# Patient Record
Sex: Male | Born: 1950 | Race: Black or African American | Hispanic: No | Marital: Single | Smoking: Current some day smoker
Health system: Southern US, Community
[De-identification: ages and names within clinical notes are randomized; demographics above are authoritative.]

## PROBLEM LIST (undated history)

## (undated) DIAGNOSIS — I10 Essential (primary) hypertension: Secondary | ICD-10-CM

## (undated) DIAGNOSIS — M1990 Unspecified osteoarthritis, unspecified site: Secondary | ICD-10-CM

## (undated) DIAGNOSIS — F319 Bipolar disorder, unspecified: Secondary | ICD-10-CM

## (undated) DIAGNOSIS — I2699 Other pulmonary embolism without acute cor pulmonale: Secondary | ICD-10-CM

## (undated) HISTORY — PX: HERNIA REPAIR: SHX51

## (undated) HISTORY — PX: TONSILLECTOMY: SUR1361

## (undated) HISTORY — PX: OTHER SURGICAL HISTORY: SHX169

## (undated) HISTORY — PX: ORIF SHOULDER DISLOCATION W/ HUMERAL FRACTURE: SUR959

---

## 1999-06-23 ENCOUNTER — Inpatient Hospital Stay (HOSPITAL_COMMUNITY): Admission: EM | Admit: 1999-06-23 | Discharge: 1999-06-29 | Payer: Self-pay | Admitting: *Deleted

## 1999-10-26 ENCOUNTER — Emergency Department (HOSPITAL_COMMUNITY): Admission: EM | Admit: 1999-10-26 | Discharge: 1999-10-26 | Payer: Self-pay | Admitting: Emergency Medicine

## 1999-10-27 ENCOUNTER — Encounter: Payer: Self-pay | Admitting: Emergency Medicine

## 2000-01-12 ENCOUNTER — Emergency Department (HOSPITAL_COMMUNITY): Admission: EM | Admit: 2000-01-12 | Discharge: 2000-01-12 | Payer: Self-pay | Admitting: Emergency Medicine

## 2005-10-17 ENCOUNTER — Emergency Department (HOSPITAL_COMMUNITY): Admission: EM | Admit: 2005-10-17 | Discharge: 2005-10-17 | Payer: Self-pay | Admitting: Emergency Medicine

## 2008-09-24 ENCOUNTER — Emergency Department (HOSPITAL_COMMUNITY): Admission: EM | Admit: 2008-09-24 | Discharge: 2008-09-24 | Payer: Self-pay | Admitting: Emergency Medicine

## 2010-01-16 ENCOUNTER — Emergency Department (HOSPITAL_COMMUNITY): Admission: EM | Admit: 2010-01-16 | Discharge: 2010-01-16 | Payer: Self-pay | Admitting: Emergency Medicine

## 2010-07-22 LAB — URINE CULTURE
Colony Count: >=100000
Culture  Setup Time: 201109101721

## 2010-07-22 LAB — URINALYSIS, ROUTINE W REFLEX MICROSCOPIC
Ketones, ur: 15 mg/dL — AB
Nitrite: POSITIVE — AB
Protein, ur: 30 mg/dL — AB
pH: 5.5 (ref 5.0–8.0)

## 2010-07-22 LAB — URINE MICROSCOPIC-ADD ON

## 2011-07-02 IMAGING — US US ART/VEN ABD/PELV/SCROTUM DOPPLER LTD
1 series · 13 of 25 positions shown · non-contrast
Comparison: None.

CLINICAL DATA: Left testicular pain.

SCROTAL ULTRASOUND
DOPPLER ULTRASOUND OF THE TESTICLES
TECHNIQUE: Complete ultrasound examination of the testicles,
epididymis, and other scrotal structures was performed.  Color and
spectral Doppler ultrasound were also utilized to evaluate blood
flow to the testicles.

[Series 1: us art/ven abd/pelv/scrotum doppler ltd · 0.07mm/px · 13 of 54 slices shown]
[im 1/54]
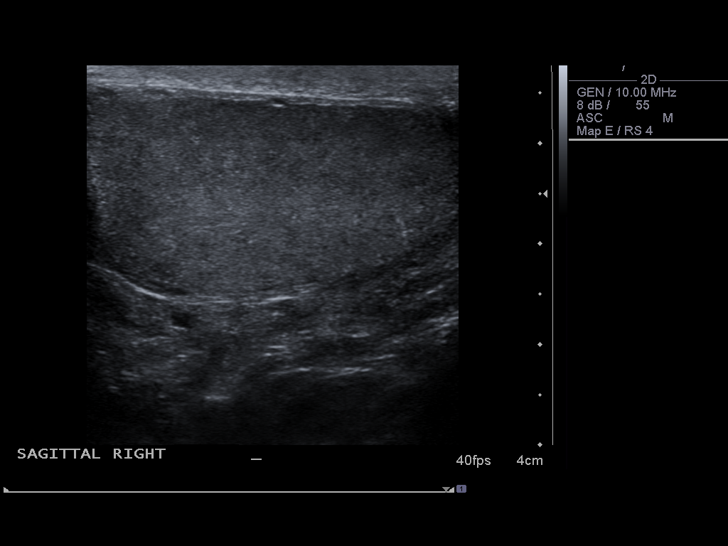
[im 5/54]
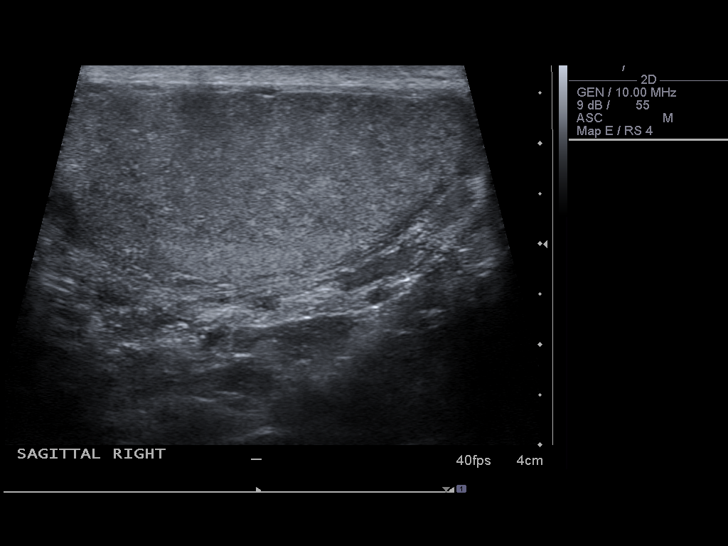
[im 9/54]
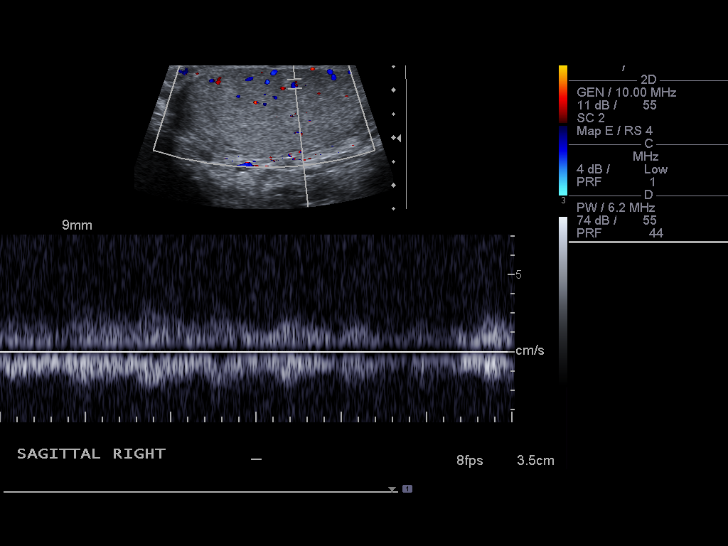
[im 14/54]
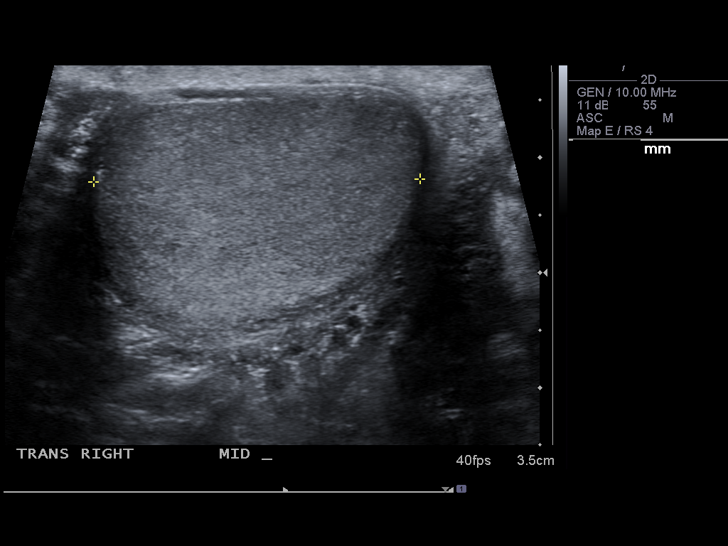
[im 18/54]
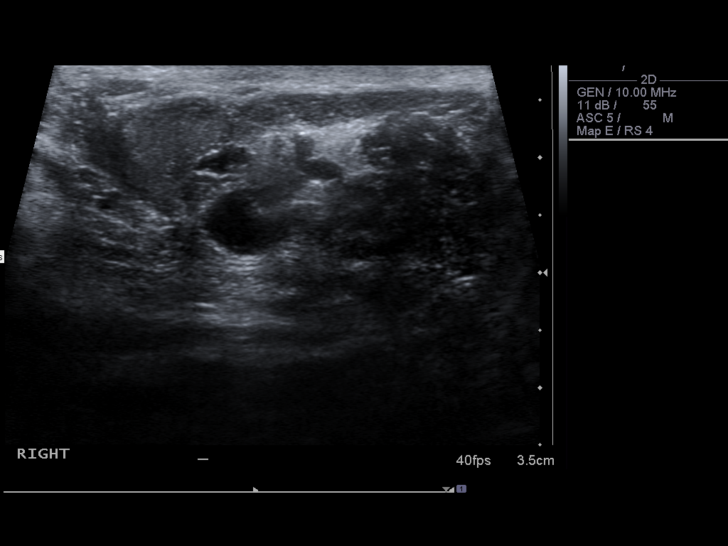
[im 23/54]
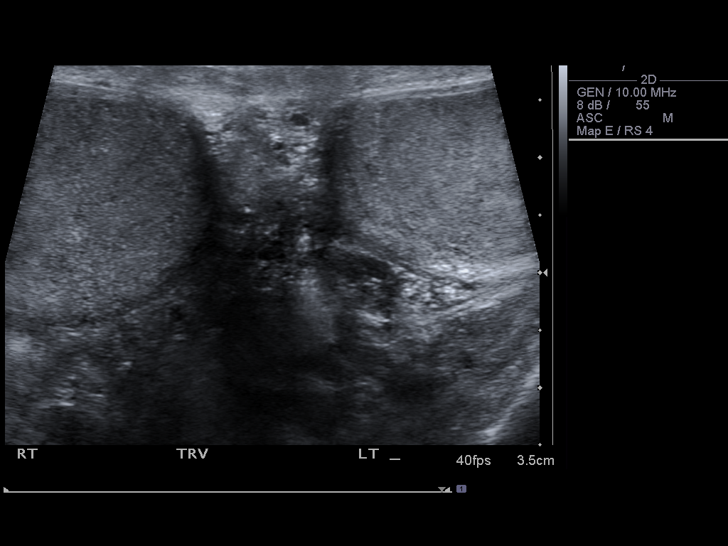
[im 27/54]
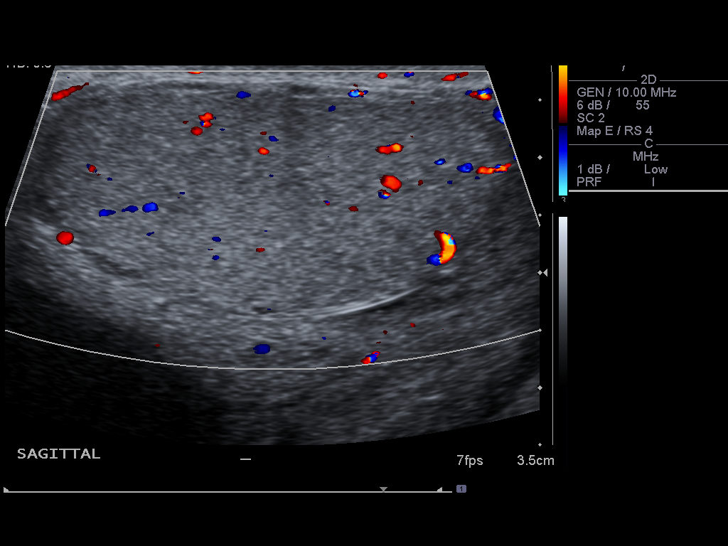
[im 31/54]
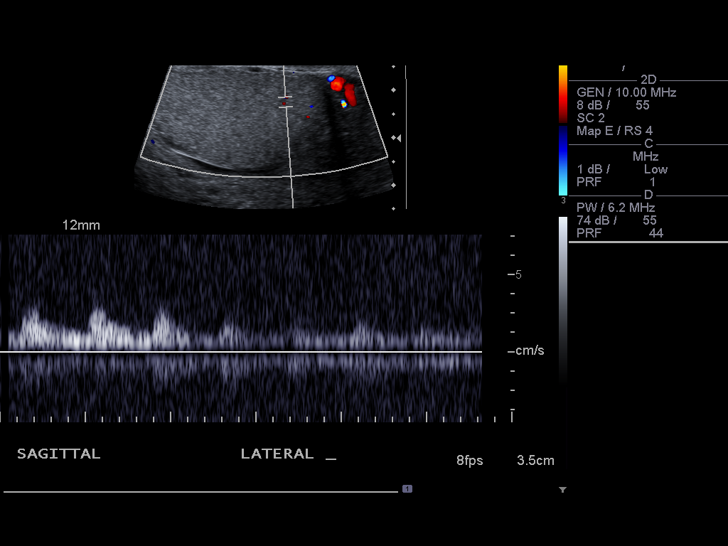
[im 36/54]
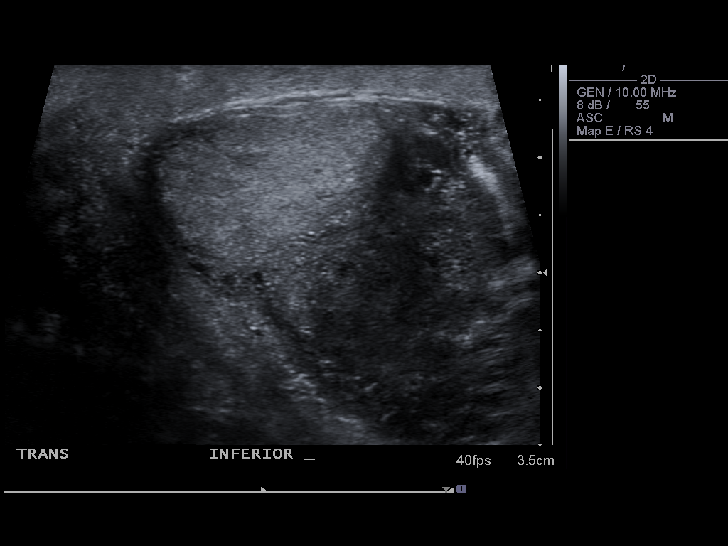
[im 40/54]
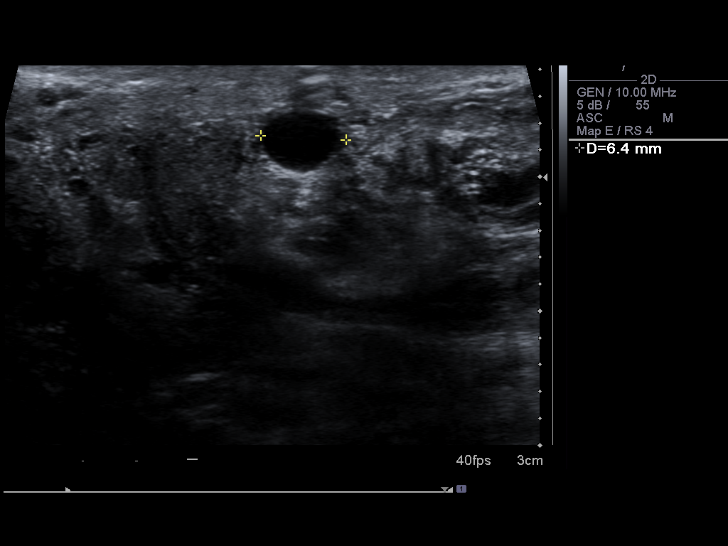
[im 45/54]
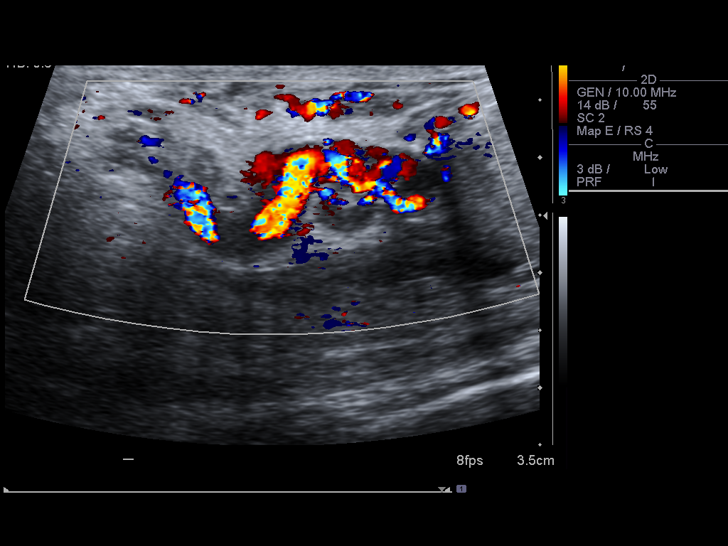
[im 49/54]
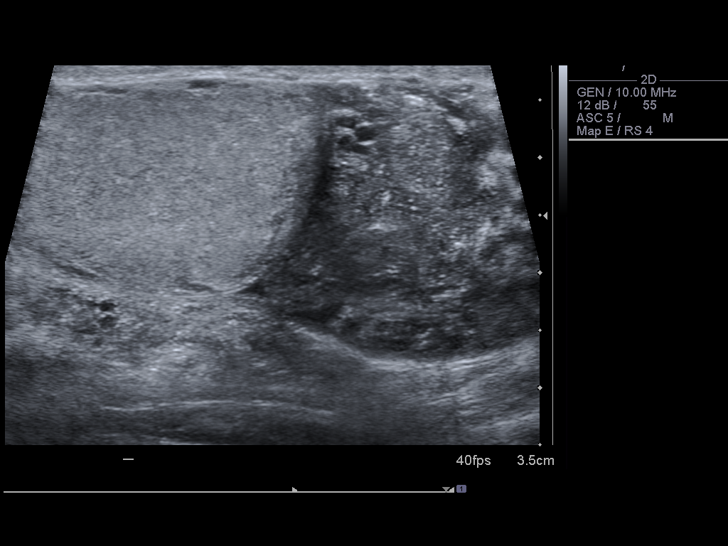
[im 54/54]
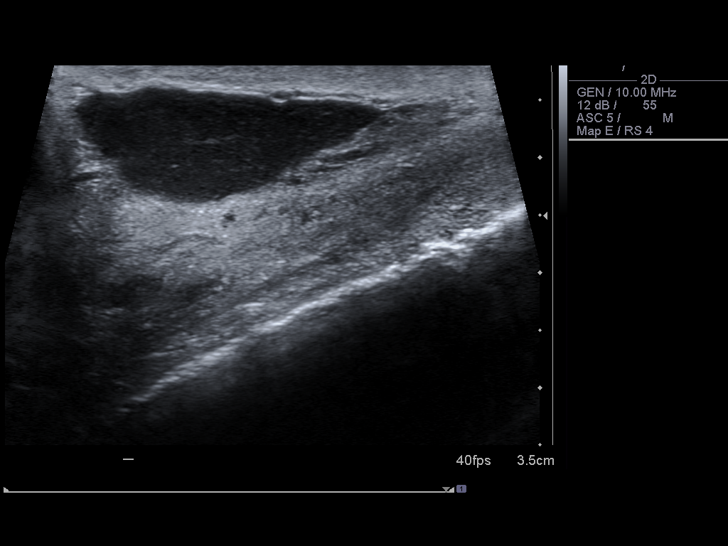

[13 of 25 positions shown; findings below may reference images not displayed]

FINDINGS: The testicles are symmetric in size and echogenicity.
No testicular masses are seen, and there is no evidence of
microlithiasis.

A small cyst or spermatocele is seen in the head of the right
epididymis measuring 8 mm, and in the head of the left epididymis
measuring 6 mm.

A large heterogeneous extratesticular mass is seen adjacent to the
left testicle which has large internal blood vessels and most
likely represents a hernia.  Small left hydrocele also noted.

Blood flow is seen within both testicles on color Doppler
sonography.  Doppler spectral waveforms show both arterial and
venous flow signal in both testicles.
IMPRESSION: 1.  No evidence of testicular mass or torsion.
2.  Large left extratesticular soft tissue mass, most likely
representing inguinal hernia.  Small left hydrocele also seen.
Recommend correlation with physical exam or CT for confirmation.

## 2014-03-21 ENCOUNTER — Ambulatory Visit (INDEPENDENT_AMBULATORY_CARE_PROVIDER_SITE_OTHER): Payer: Self-pay | Admitting: General Surgery

## 2014-03-21 ENCOUNTER — Other Ambulatory Visit (INDEPENDENT_AMBULATORY_CARE_PROVIDER_SITE_OTHER): Payer: Self-pay

## 2014-03-21 DIAGNOSIS — F1911 Other psychoactive substance abuse, in remission: Secondary | ICD-10-CM

## 2014-03-21 NOTE — H&P (Signed)
History of Present Illness Axel Filler(Vennela Jutte MD; 03/10/2014 1:48 PM) Patient words: Evaluate for Right Inguinal Hernia.  The patient is a 63 year old male who presents for an evaluation of a hernia. The patient is a 63 year old male who is referred by Dr. [name] for evaluation of a right inguinal hernia. The patient states it's been there for approximately 20 years. He states become more symptomatic at this time. Patient states it's not reducible.  Patient has had no signs or symptoms of strangulation.   Other Problems Maryan Puls([name], [place]; 03/10/2014 1:35 PM) Arthritis Back Pain Inguinal Hernia  Past Surgical History Maryan Puls([name]; 03/10/2014 1:35 PM) No pertinent past surgical history  Diagnostic Studies History Maryan Puls([name], [place]; 03/10/2014 1:35 PM) Colonoscopy never  Allergies Maryan Puls([name], [place]; 03/10/2014 1:35 PM) No Known Drug Allergies11/06/2013  Social History [name]([place]y [place]; 03/10/2014 1:35 PM) Alcohol use Recently quit alcohol use. Caffeine use Carbonated beverages, Coffee. Illicit drug use Uses weekly. Tobacco use Current every day smoker.  Family History Maryan Puls([name]; 03/10/2014 1:35 PM) Diabetes Mellitus Brother, Mother. Heart Disease Father. Hypertension Father.  Review of Systems Axel Filler(Cindi Ghazarian MD; 03/10/2014 1:47 PM) General Not Present- Appetite Loss, Chills, Fatigue, Fever, Night Sweats, Weight Gain and Weight Loss. Skin Not Present- Change in Wart/Mole, Dryness, Hives, Jaundice, New Lesions, Non-Healing Wounds, Rash and Ulcer. HEENT Not Present- Earache, Hearing Loss, Hoarseness, Nose Bleed, Oral Ulcers, Ringing in the Ears, Seasonal Allergies, Sinus Pain, Sore Throat, Visual Disturbances, Wears glasses/contact lenses and Yellow Eyes. Respiratory Not Present- Bloody sputum, Chronic Cough, Difficulty Breathing, Snoring and Wheezing. Breast Not Present- Breast Mass, Breast Pain, Nipple Discharge and Skin Changes. Cardiovascular Present-  Leg Cramps. Not Present- Chest Pain, Difficulty Breathing Lying Down, Palpitations, Rapid Heart Rate, Shortness of Breath and Swelling of Extremities. Gastrointestinal Not Present- Abdominal Pain, Bloating, Bloody Stool, Change in Bowel Habits, Chronic diarrhea, Constipation, Difficulty Swallowing, Excessive gas, Gets full quickly at meals, Hemorrhoids, Indigestion, Nausea, Rectal Pain and Vomiting. Male Genitourinary Not Present- Blood in Urine, Change in Urinary Stream, Frequency, Impotence, Nocturia, Painful Urination, Urgency and Urine Leakage. Musculoskeletal Not Present- Back Pain, Joint Pain, Joint Stiffness, Muscle Pain, Muscle Weakness and Swelling of Extremities. Neurological Not Present- Decreased Memory, Fainting, Headaches, Numbness, Seizures, Tingling, Tremor, Trouble walking and Weakness. Psychiatric Not Present- Anxiety, Bipolar, Change in Sleep Pattern, Depression, Fearful and Frequent crying. Endocrine Not Present- Cold Intolerance, Excessive Hunger, Hair Changes, Heat Intolerance and New Diabetes. Hematology Not Present- Easy Bruising, Excessive bleeding, Gland problems, HIV and Persistent Infections.   Vitals Maryan Puls([name] MA; 03/10/2014 1:34 PM) 03/10/2014 1:33 PM Weight: 193.4 lb Height: 75in Body Surface Area: 2.15 m Body Mass Index: 24.17 kg/m Temp.: 97.29F(Temporal)  Pulse: 71 (Regular)  BP: 150/92 (Sitting, Left Arm, Standard)    Physical Exam Axel Filler(Jonny Dearden MD; 03/10/2014 1:49 PM) General Mental Status-Alert. General Appearance-Consistent with stated age. Hydration-Well hydrated. Voice-Normal.  Head and Neck Head-normocephalic, atraumatic with no lesions or palpable masses.  Chest and Lung Exam Chest and lung exam reveals -quiet, even and easy respiratory effort with no use of accessory muscles, normal resonance, no flatness or dullness, non-tender and normal tactile fremitus and on auscultation, normal breath sounds, no adventitious  sounds and normal vocal resonance. Inspection Chest Wall - Normal. Back - normal.  Cardiovascular Cardiovascular examination reveals -on palpation PMI is normal in location and amplitude, no palpable S3 or S4. Normal cardiac borders., normal heart sounds, regular rate and rhythm with no murmurs, carotid auscultation reveals no bruits and normal pedal  pulses bilaterally.  Abdomen Inspection Skin - Scar - no surgical scars. Hernias - Diastasis recti - Present. Inguinal hernia - Right - Incarcerated. Palpation/Percussion Normal exam - Soft, Non Tender, No Rebound tenderness, No Rigidity (guarding) and No hepatosplenomegaly. Auscultation Normal exam - Bowel sounds normal.    Assessment & Plan Axel Filler(Jermya Dowding MD; 03/10/2014 1:50 PM) INGUINAL HERNIA (550.90  K40.90) Impression: 63 year old male with a right incarcerated inguinal hernia. The patient will like to proceed to the operating room for open right inguinal hernia repair with mesh. All risks and benefits were discussed with the patient to generally include, but not limited to: infection, bleeding, damage to surrounding structures, acute and chronic nerve pain, and recurrence. Alternatives were offered and described. All questions were answered and the patient voiced understanding of the procedure and wishes to proceed at this point with hernia repair. Current Plans  Recommend surgery I discussed hernia surgery with the patient to include the risks and benefits to include, but not limited to: infection, bleeding, damage to surrounding structures, chronic wound healing, nerve pain, and possible recurrence. The patient voiced understanding and wishes to proceed at this time.

## 2014-03-25 ENCOUNTER — Encounter (HOSPITAL_COMMUNITY)
Admission: RE | Admit: 2014-03-25 | Discharge: 2014-03-25 | Disposition: A | Discharge status: Home / Self Care | Payer: Medicare Other | Source: Ambulatory Visit | Attending: General Surgery | Admitting: General Surgery

## 2014-03-25 ENCOUNTER — Encounter (HOSPITAL_COMMUNITY): Payer: Self-pay

## 2014-03-25 DIAGNOSIS — M1990 Unspecified osteoarthritis, unspecified site: Secondary | ICD-10-CM | POA: Diagnosis not present

## 2014-03-25 DIAGNOSIS — F17210 Nicotine dependence, cigarettes, uncomplicated: Secondary | ICD-10-CM | POA: Diagnosis not present

## 2014-03-25 DIAGNOSIS — I10 Essential (primary) hypertension: Secondary | ICD-10-CM | POA: Diagnosis not present

## 2014-03-25 DIAGNOSIS — K4090 Unilateral inguinal hernia, without obstruction or gangrene, not specified as recurrent: Secondary | ICD-10-CM | POA: Diagnosis present

## 2014-03-25 HISTORY — DX: Unspecified osteoarthritis, unspecified site: M19.90

## 2014-03-25 HISTORY — DX: Bipolar disorder, unspecified: F31.9

## 2014-03-25 LAB — CBC
HEMATOCRIT: 48.1 % (ref 39.0–52.0)
HEMOGLOBIN: 16.4 g/dL (ref 13.0–17.0)
MCH: 29.8 pg (ref 26.0–34.0)
MCHC: 34.1 g/dL (ref 30.0–36.0)
MCV: 87.3 fL (ref 78.0–100.0)
Platelets: 203 10*3/uL (ref 150–400)
RBC: 5.51 MIL/uL (ref 4.22–5.81)
RDW: 13.5 % (ref 11.5–15.5)
WBC: 9.4 10*3/uL (ref 4.0–10.5)

## 2014-03-25 LAB — COMPREHENSIVE METABOLIC PANEL
ALT: 8 U/L (ref 0–53)
AST: 16 U/L (ref 0–37)
Albumin: 4 g/dL (ref 3.5–5.2)
Alkaline Phosphatase: 84 U/L (ref 39–117)
Anion gap: 19 — ABNORMAL HIGH (ref 5–15)
BILIRUBIN TOTAL: 0.4 mg/dL (ref 0.3–1.2)
BUN: 9 mg/dL (ref 6–23)
CALCIUM: 9 mg/dL (ref 8.4–10.5)
CHLORIDE: 101 meq/L (ref 96–112)
CO2: 21 meq/L (ref 19–32)
CREATININE: 0.82 mg/dL (ref 0.50–1.35)
GLUCOSE: 84 mg/dL (ref 70–99)
Potassium: 4 mEq/L (ref 3.7–5.3)
Sodium: 141 mEq/L (ref 137–147)
Total Protein: 7 g/dL (ref 6.0–8.3)

## 2014-03-25 MED ORDER — CEFAZOLIN SODIUM-DEXTROSE 2-3 GM-% IV SOLR
2.0000 g | INTRAVENOUS | Status: AC
  Administered 2014-03-26: 2 g via INTRAVENOUS
  Filled 2014-03-25: qty 50

## 2014-03-25 NOTE — Pre-Procedure Instructions (Signed)
Mark Cantu  03/25/2014   Your procedure is scheduled on:  Wednesday, Nov. 18th   [facility] 8:00 AM.   Call this number if you have problems the morning of surgery: [phone]   Remember:   Do not eat food or drink liquids after midnight tonight.    Take these medicines the morning of surgery with A SIP OF WATER:    Do not wear jewelry - no rings or watches.  Do not wear lotions or colognes.  You may NOT wear deodorant the morning of surgery.   Men may shave face and neck.   Do not bring valuables to the hospital.  [facility] is not responsible for any belongings or valuables.               Contacts, dentures or bridgework may not be worn into surgery.  Leave suitcase in the car. After surgery it may be brought to your room.  For patients admitted to the hospital, discharge time is determined by your treatment team.    Name and phone number of your driver:    Special Instructions: "Preparing for Surgery" instruction sheet.   Please read over the following fact sheets that you were given: Pain Booklet and Surgical Site Infection Prevention
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
## 2014-03-26 ENCOUNTER — Encounter (HOSPITAL_COMMUNITY): Payer: Self-pay | Admitting: *Deleted

## 2014-03-26 ENCOUNTER — Ambulatory Visit (HOSPITAL_COMMUNITY)
Admission: RE | Admit: 2014-03-26 | Discharge: 2014-03-26 | Disposition: A | Discharge status: Home / Self Care | Payer: Medicare Other | Source: Ambulatory Visit | Attending: General Surgery | Admitting: General Surgery

## 2014-03-26 ENCOUNTER — Ambulatory Visit (HOSPITAL_COMMUNITY): Payer: Medicare Other | Admitting: Anesthesiology

## 2014-03-26 ENCOUNTER — Ambulatory Visit (HOSPITAL_COMMUNITY): Payer: Medicare Other | Admitting: Vascular Surgery

## 2014-03-26 ENCOUNTER — Encounter (HOSPITAL_COMMUNITY)
Admission: RE | Disposition: A | Discharge status: Home / Self Care | Payer: Self-pay | Source: Ambulatory Visit | Attending: General Surgery

## 2014-03-26 DIAGNOSIS — F17210 Nicotine dependence, cigarettes, uncomplicated: Secondary | ICD-10-CM | POA: Insufficient documentation

## 2014-03-26 DIAGNOSIS — K4090 Unilateral inguinal hernia, without obstruction or gangrene, not specified as recurrent: Secondary | ICD-10-CM | POA: Insufficient documentation

## 2014-03-26 DIAGNOSIS — I10 Essential (primary) hypertension: Secondary | ICD-10-CM | POA: Insufficient documentation

## 2014-03-26 DIAGNOSIS — M1990 Unspecified osteoarthritis, unspecified site: Secondary | ICD-10-CM | POA: Diagnosis not present

## 2014-03-26 HISTORY — PX: INGUINAL HERNIA REPAIR: SHX194

## 2014-03-26 HISTORY — PX: INSERTION OF MESH: SHX5868

## 2014-03-26 LAB — RAPID URINE DRUG SCREEN, HOSP PERFORMED
AMPHETAMINES: NOT DETECTED
BENZODIAZEPINES: NOT DETECTED
Barbiturates: NOT DETECTED
Cocaine: NOT DETECTED
OPIATES: NOT DETECTED
Tetrahydrocannabinol: NOT DETECTED

## 2014-03-26 SURGERY — REPAIR, HERNIA, INGUINAL, ADULT
Anesthesia: General | Laterality: Right

## 2014-03-26 MED ORDER — LIDOCAINE HCL (CARDIAC) 20 MG/ML IV SOLN
INTRAVENOUS | Status: DC | PRN
  Administered 2014-03-26: 20 mg via INTRAVENOUS

## 2014-03-26 MED ORDER — BUPIVACAINE-EPINEPHRINE (PF) 0.25% -1:200000 IJ SOLN
INTRAMUSCULAR | Status: AC
  Filled 2014-03-26: qty 30

## 2014-03-26 MED ORDER — BUPIVACAINE-EPINEPHRINE 0.25% -1:200000 IJ SOLN
INTRAMUSCULAR | Status: DC | PRN
  Administered 2014-03-26: 10 mL

## 2014-03-26 MED ORDER — LACTATED RINGERS IV SOLN
INTRAVENOUS | Status: DC
  Administered 2014-03-26: 09:00:00 via INTRAVENOUS

## 2014-03-26 MED ORDER — DEXAMETHASONE SODIUM PHOSPHATE 10 MG/ML IJ SOLN
INTRAMUSCULAR | Status: DC | PRN
  Administered 2014-03-26: 10 mg via INTRAVENOUS

## 2014-03-26 MED ORDER — 0.9 % SODIUM CHLORIDE (POUR BTL) OPTIME
TOPICAL | Status: DC | PRN
  Administered 2014-03-26: 1000 mL

## 2014-03-26 MED ORDER — OXYCODONE-ACETAMINOPHEN 7.5-325 MG PO TABS
1.0000 | ORAL_TABLET | ORAL | Status: DC | PRN
Start: 2014-03-26 — End: 2014-11-20

## 2014-03-26 MED ORDER — LIDOCAINE HCL (CARDIAC) 20 MG/ML IV SOLN
INTRAVENOUS | Status: AC
  Filled 2014-03-26: qty 5

## 2014-03-26 MED ORDER — ONDANSETRON HCL 4 MG/2ML IJ SOLN
INTRAMUSCULAR | Status: DC | PRN
  Administered 2014-03-26: 4 mg via INTRAVENOUS

## 2014-03-26 MED ORDER — MIDAZOLAM HCL 5 MG/5ML IJ SOLN
INTRAMUSCULAR | Status: DC | PRN
  Administered 2014-03-26 (×2): 1 mg via INTRAVENOUS

## 2014-03-26 MED ORDER — FENTANYL CITRATE 0.05 MG/ML IJ SOLN
INTRAMUSCULAR | Status: DC | PRN
  Administered 2014-03-26 (×3): 50 ug via INTRAVENOUS
  Administered 2014-03-26: 100 ug via INTRAVENOUS
  Administered 2014-03-26 (×2): 50 ug via INTRAVENOUS

## 2014-03-26 MED ORDER — LACTATED RINGERS IV SOLN
INTRAVENOUS | Status: DC | PRN
  Administered 2014-03-26 (×2): via INTRAVENOUS

## 2014-03-26 MED ORDER — MIDAZOLAM HCL 2 MG/2ML IJ SOLN
INTRAMUSCULAR | Status: AC
  Filled 2014-03-26: qty 2

## 2014-03-26 MED ORDER — ROCURONIUM BROMIDE 50 MG/5ML IV SOLN
INTRAVENOUS | Status: AC
  Filled 2014-03-26: qty 1

## 2014-03-26 MED ORDER — CHLORHEXIDINE GLUCONATE 4 % EX LIQD
1.0000 "application " | Freq: Once | CUTANEOUS | Status: DC
  Filled 2014-03-26: qty 15

## 2014-03-26 MED ORDER — FENTANYL CITRATE 0.05 MG/ML IJ SOLN
INTRAMUSCULAR | Status: AC
  Filled 2014-03-26: qty 5

## 2014-03-26 MED ORDER — ONDANSETRON HCL 4 MG/2ML IJ SOLN
INTRAMUSCULAR | Status: AC
  Filled 2014-03-26: qty 2

## 2014-03-26 MED ORDER — FENTANYL CITRATE 0.05 MG/ML IJ SOLN
INTRAMUSCULAR | Status: AC
  Filled 2014-03-26: qty 2

## 2014-03-26 MED ORDER — GLYCOPYRROLATE 0.2 MG/ML IJ SOLN
INTRAMUSCULAR | Status: AC
  Filled 2014-03-26: qty 3

## 2014-03-26 MED ORDER — PROPOFOL 10 MG/ML IV BOLUS
INTRAVENOUS | Status: AC
  Filled 2014-03-26: qty 20

## 2014-03-26 MED ORDER — NEOSTIGMINE METHYLSULFATE 10 MG/10ML IV SOLN
INTRAVENOUS | Status: AC
  Filled 2014-03-26: qty 1

## 2014-03-26 MED ORDER — PROPOFOL 10 MG/ML IV BOLUS
INTRAVENOUS | Status: DC | PRN
  Administered 2014-03-26: 150 mg via INTRAVENOUS
  Administered 2014-03-26: 50 mg via INTRAVENOUS

## 2014-03-26 SURGICAL SUPPLY — 46 items
BLADE SURG ROTATE 9660 (MISCELLANEOUS) IMPLANT
CANISTER SUCTION 2500CC (MISCELLANEOUS) ×4 IMPLANT
CHLORAPREP W/TINT 26ML (MISCELLANEOUS) ×4 IMPLANT
COVER SURGICAL LIGHT HANDLE (MISCELLANEOUS) ×4 IMPLANT
DERMABOND ADVANCED (GAUZE/BANDAGES/DRESSINGS) ×2
DERMABOND ADVANCED .7 DNX12 (GAUZE/BANDAGES/DRESSINGS) ×2 IMPLANT
DRAIN PENROSE 1/2X12 LTX STRL (WOUND CARE) ×4 IMPLANT
DRAPE LAPAROTOMY TRNSV 102X78 (DRAPE) ×4 IMPLANT
DRAPE UTILITY 15X26 W/TAPE STR (DRAPE) ×8 IMPLANT
ELECT CAUTERY BLADE 6.4 (BLADE) ×4 IMPLANT
ELECT REM PT RETURN 9FT ADLT (ELECTROSURGICAL) ×4
ELECTRODE REM PT RTRN 9FT ADLT (ELECTROSURGICAL) ×2 IMPLANT
GAUZE SPONGE 4X4 16PLY XRAY LF (GAUZE/BANDAGES/DRESSINGS) ×4 IMPLANT
GLOVE BIO SURGEON STRL SZ7.5 (GLOVE) ×4 IMPLANT
GLOVE BIOGEL PI IND STRL 8 (GLOVE) ×2 IMPLANT
GLOVE BIOGEL PI INDICATOR 8 (GLOVE) ×2
GOWN STRL REUS W/ TWL LRG LVL3 (GOWN DISPOSABLE) ×4 IMPLANT
GOWN STRL REUS W/ TWL XL LVL3 (GOWN DISPOSABLE) ×2 IMPLANT
GOWN STRL REUS W/TWL LRG LVL3 (GOWN DISPOSABLE) ×4
GOWN STRL REUS W/TWL XL LVL3 (GOWN DISPOSABLE) ×2
KIT BASIN OR (CUSTOM PROCEDURE TRAY) ×4 IMPLANT
KIT ROOM TURNOVER OR (KITS) ×4 IMPLANT
MESH PARIETEX PROGRIP RIGHT (Mesh General) ×4 IMPLANT
NEEDLE HYPO 25GX1X1/2 BEV (NEEDLE) ×4 IMPLANT
NS IRRIG 1000ML POUR BTL (IV SOLUTION) ×4 IMPLANT
PACK SURGICAL SETUP 50X90 (CUSTOM PROCEDURE TRAY) ×4 IMPLANT
PAD ARMBOARD 7.5X6 YLW CONV (MISCELLANEOUS) ×8 IMPLANT
PENCIL BUTTON HOLSTER BLD 10FT (ELECTRODE) ×4 IMPLANT
SPONGE INTESTINAL PEANUT (DISPOSABLE) IMPLANT
SPONGE LAP 18X18 X RAY DECT (DISPOSABLE) ×4 IMPLANT
SUT MNCRL AB 4-0 PS2 18 (SUTURE) ×4 IMPLANT
SUT PDS AB 0 CT 36 (SUTURE) IMPLANT
SUT PROLENE 2 0 SH DA (SUTURE) ×4 IMPLANT
SUT VIC AB 0 CT2 27 (SUTURE) ×4 IMPLANT
SUT VIC AB 2-0 SH 27 (SUTURE) ×4
SUT VIC AB 2-0 SH 27X BRD (SUTURE) ×4 IMPLANT
SUT VIC AB 3-0 SH 27 (SUTURE) ×2
SUT VIC AB 3-0 SH 27XBRD (SUTURE) ×2 IMPLANT
SUT VICRYL AB 2 0 TIES (SUTURE) ×4 IMPLANT
SYR CONTROL 10ML LL (SYRINGE) ×4 IMPLANT
TOWEL OR 17X24 6PK STRL BLUE (TOWEL DISPOSABLE) ×4 IMPLANT
TOWEL OR 17X26 10 PK STRL BLUE (TOWEL DISPOSABLE) ×4 IMPLANT
TRAY FOLEY CATH 16FR SILVER (SET/KITS/TRAYS/PACK) ×4 IMPLANT
TUBE CONNECTING 12'X1/4 (SUCTIONS) ×1
TUBE CONNECTING 12X1/4 (SUCTIONS) ×3 IMPLANT
YANKAUER SUCT BULB TIP NO VENT (SUCTIONS) IMPLANT

## 2014-03-26 NOTE — Op Note (Signed)
03/26/2014  12:22 PM  PATIENT:  Mark Cantu  63 y.o. male  PRE-OPERATIVE DIAGNOSIS:  right inguinal hernia  POST-OPERATIVE DIAGNOSIS:  right Direct inguinal hernia  PROCEDURE:  Procedure(s): OPEN RIGHT INGUINAL HERNIA REPAIR WITH MESH (Right) INSERTION OF MESH (N/A)  SURGEON:  Surgeon(s) and Role:    * Axel FillerArmando Dorcas Melito, MD - Primary  ASSISTANTS: Corky CraftsBrittany Pjetraj, PA-student   ANESTHESIA:   local and general  EBL:     BLOOD ADMINISTERED:none  DRAINS: none   LOCAL MEDICATIONS USED:  BUPIVICAINE   SPECIMEN:  No Specimen  DISPOSITION OF SPECIMEN:  N/A  COUNTS:  YES  TOURNIQUET:  * No tourniquets in log *  DICTATION: .Dragon Dictation Details of the procedure: The patient was taken back to the operating room. The patient was placed in supine position with bilateral SCDs in place. The patient was prepped and draped in the usual sterile fashion.  After appropriate anitbiotics were confirmed, a time-out was confirmed and all facts were verified.  Quarter percent Marcaine was used to infiltrate the area of the incision and an ilioinguinal nerve block was also placed. A 5 cm incision was made just 1 cm superior to the inguinal ligament. Bovie cautery was used to maintain hemostasis dissection is carried down to the external oblique.  A standard incision was made laterally, and the external oblique was bluntly dissected away from the surrounding tissue with Metzenbaum scissors. Incision was made through the external oblique and lateral stab wound through the external ring. The external oblique was elevated in the spermatic cord was bluntly dissected away from the surrounding tissue.  The ilioinguinal nerve was identified and ligated proximally. The spermatic cord and the hernia were then bluntly dissected away from the pubic tubercle and a Penrose was placed around the hernia sac in the spermatic cord. Dissection cremasterics to place a Bovie cautery. The hernia sac was dissected back to  the internal inguinal ring, the sac was seen medial to the inferior epigastric vessles. Since the sac was so large, it was imbricated with a 2-0 vicryl, imbricating the internal oblique. This was reduced into the abdomen.  A right sided Pro Grip mesh was placed over the hernia floor.  This was anchored to pubic tubercle with a 2-0 prolene.  The superior edge was fixated to the conjoint tendon with a interuptted Prolene.  The tails were then placed and tucked under the external oblique.   The mesh surrounding the spermatic cord at the internal oblique was then tightened but not strangulated. At this time the area was irrigated out with sterile saline.  The external oblique was reapproximated using a 2-0 Vicryl in a running fashion. Scarpa's fascia was then reapproximated using a 3-0 Vicryl running fashion. The skin was then reapproximated with 4 Monocryl in a subcuticular fashion. The skin was then dressed with Dermabond.  The patient was taken to the recovery room in stable condition.   PLAN OF CARE: Discharge to home after PACU  PATIENT DISPOSITION:  PACU - hemodynamically stable.   Delay start of Pharmacological VTE agent (>24hrs) due to surgical blood loss or risk of bleeding: not applicable

## 2014-03-26 NOTE — Discharge Instructions (Signed)
[facility] _______[facility] ° °INGUINAL HERNIA REPAIR: POST OP INSTRUCTIONS ° °Always review your discharge instruction sheet given to you by the facility where your surgery was performed. °IF YOU HAVE DISABILITY OR FAMILY LEAVE FORMS, YOU MUST BRING THEM TO THE OFFICE FOR PROCESSING.   °DO NOT GIVE THEM TO YOUR DOCTOR. ° °1. A  prescription for pain medication may be given to you upon discharge.  Take your pain medication as prescribed, if needed.  If narcotic pain medicine is not needed, then you may take acetaminophen (Tylenol) or ibuprofen (Advil) as needed. °2. Take your usually prescribed medications unless otherwise directed. °3. If you need a refill on your pain medication, please contact your pharmacy.  They will contact our office to request authorization. Prescriptions will not be filled after 5 pm or on week-ends. °4. You should follow a light diet the first 24 hours after arrival home, such as soup and crackers, etc.  Be sure to include lots of fluids daily.  Resume your normal diet the day after surgery. °5. Most patients will experience some swelling and bruising around the umbilicus or in the groin and scrotum.  Ice packs and reclining will help.  Swelling and bruising can take several days to resolve.  °6. It is common to experience some constipation if taking pain medication after surgery.  Increasing fluid intake and taking a stool softener (such as Colace) will usually help or prevent this problem from occurring.  A mild laxative (Milk of Magnesia or Miralax) should be taken according to package directions if there are no bowel movements after 48 hours. °7. Unless discharge instructions indicate otherwise, you may remove your bandages 24-48 hours after surgery, and you may shower at that time.  You may have steri-strips (small skin tapes) in place directly over the incision.  These strips should be left on the skin for 7-10 days.  If your surgeon used skin glue on the incision, you  may shower in 24 hours.  The glue will flake off over the next 2-3 weeks.  Any sutures or staples will be removed at the office during your follow-up visit. °8. ACTIVITIES:  You may resume regular (light) daily activities beginning the next day--such as daily self-care, walking, climbing stairs--gradually increasing activities as tolerated.  You may have sexual intercourse when it is comfortable.  Refrain from any heavy lifting or straining until approved by your doctor. °a. You may drive when you are no longer taking prescription pain medication, you can comfortably wear a seatbelt, and you can safely maneuver your car and apply brakes. °b. RETURN TO WORK:  __________________________________________________________ °9. You should see your doctor in the office for a follow-up appointment approximately 2-3 weeks after your surgery.  Make sure that you call for this appointment within a day or two after you arrive home to insure a convenient appointment time. °10. OTHER INSTRUCTIONS:  __________________________________________________________________________________________________________________________________________________________________________________________  °WHEN TO CALL YOUR DOCTOR: °1. Fever over 101.0 °2. Inability to urinate °3. Nausea and/or vomiting °4. Extreme swelling or bruising °5. Continued bleeding from incision. °6. Increased pain, redness, or drainage from the incision ° °The clinic staff is available to answer your questions during regular business hours.  Please don’t hesitate to call and ask to speak to one of the nurses for clinical concerns.  If you have a medical emergency, go to the nearest emergency room or call 911.  A surgeon from [facility] is always on call at the hospital ° ° °[address], [address], [place], [place]  [address] ? ° [address] [address], [place], [place]   [address] °[phone] ? [phone] ? FAX [phone] °Web site:  [email or web address] ° °General Anesthesia, Adult, Care After  °Refer to this sheet in the next few weeks. These instructions provide you with information on caring for yourself after your procedure. Your health care provider may also give you more specific instructions. Your treatment has been planned according to current medical practices, but problems sometimes occur. Call your health care provider if you have any problems or questions after your procedure.  °WHAT TO EXPECT AFTER THE PROCEDURE  °After the procedure, it is typical to experience:  °Sleepiness.  °Nausea and vomiting. °HOME CARE INSTRUCTIONS  °For the first 24 hours after general anesthesia:  °Have a responsible person with you.  °Do not drive a car. If you are alone, do not take public transportation.  °Do not drink alcohol.  °Do not take medicine that has not been prescribed by your health care provider.  °Do not sign important papers or make important decisions.  °You may resume a normal diet and activities as directed by your health care provider.  °Change bandages (dressings) as directed.  °If you have questions or problems that seem related to general anesthesia, call the hospital and ask for the anesthetist or anesthesiologist on call. °SEEK MEDICAL CARE IF:  °You have nausea and vomiting that continue the day after anesthesia.  °You develop a rash. °SEEK IMMEDIATE MEDICAL CARE IF:  °You have difficulty breathing.  °You have chest pain.  °You have any allergic problems. °Document Released: 08/01/2000 Document Revised: 12/26/2012 Document Reviewed: 11/08/2012  °Exit[employer]® Patient Information ©2014 [employer].  ° ° °

## 2014-03-26 NOTE — Transfer of Care (Signed)
Immediate Anesthesia Transfer of Care Note  Patient: Mark Cantu  Procedure(s) Performed: Procedure(s): OPEN RIGHT INGUINAL HERNOA REPAIR WITH MESH (Right) INSERTION OF MESH (N/A)  Patient Location: PACU  Anesthesia Type:General  Level of Consciousness: awake and alert   Airway & Oxygen Therapy: Patient Spontanous Breathing and Patient connected to nasal cannula oxygen  Post-op Assessment: Report given to PACU RN and Post -op Vital signs reviewed and stable  Post vital signs: Reviewed and stable  Complications: No apparent anesthesia complications

## 2014-03-26 NOTE — Progress Notes (Signed)
Dr. Derrell Lollingamirez aware of patient hypertension, new orders received.  Collecting urine @ this time per MD. Will continue to monitor.

## 2014-03-26 NOTE — Anesthesia Postprocedure Evaluation (Signed)
  Anesthesia Post-op Note  Patient: Mark Cantu  Procedure(s) Performed: Procedure(s): OPEN RIGHT INGUINAL HERNOA REPAIR WITH MESH (Right) INSERTION OF MESH (N/A)  Patient Location: PACU  Anesthesia Type:General  Level of Consciousness: awake, alert , oriented and patient cooperative  Airway and Oxygen Therapy: Patient Spontanous Breathing  Post-op Pain: mild  Post-op Assessment: Post-op Vital signs reviewed, Patient's Cardiovascular Status Stable, Respiratory Function Stable, Patent Airway, No signs of Nausea or vomiting and Pain level controlled  Post-op Vital Signs: Reviewed and stable  Last Vitals:  Filed Vitals:   03/26/14 1330  BP:   Pulse: 71  Temp: 36.4 C  Resp: 15    Complications: No apparent anesthesia complications

## 2014-03-26 NOTE — Anesthesia Preprocedure Evaluation (Addendum)
Anesthesia Evaluation  Patient identified by MRN, date of birth, ID band Patient awake    Reviewed: Allergy & Precautions, H&P , NPO status , Patient's Chart, lab work & pertinent test results  History of Anesthesia Complications Negative for: history of anesthetic complications  Airway Mallampati: I  TM Distance: >3 FB Neck ROM: Full    Dental  (+) Edentulous Upper, Edentulous Lower   Pulmonary COPDCurrent Smoker,  breath sounds clear to auscultation        Cardiovascular - anginanegative cardio ROS  Rhythm:Regular Rate:Normal     Neuro/Psych negative neurological ROS     GI/Hepatic negative GI ROS, (+)     substance abuse  alcohol use and cocaine use,   Endo/Other  negative endocrine ROS  Renal/GU negative Renal ROS     Musculoskeletal   Abdominal   Peds  Hematology negative hematology ROS (+)   Anesthesia Other Findings   Reproductive/Obstetrics                            Anesthesia Physical Anesthesia Plan  ASA: II  Anesthesia Plan: General   Post-op Pain Management:    Induction: Intravenous  Airway Management Planned: LMA  Additional Equipment:   Intra-op Plan:   Post-operative Plan:   Informed Consent: I have reviewed the patients History and Physical, chart, labs and discussed the procedure including the risks, benefits and alternatives for the proposed anesthesia with the patient or authorized representative who has indicated his/her understanding and acceptance.     Plan Discussed with: CRNA and Surgeon  Anesthesia Plan Comments: (Plan routine monitors, GA-LMA OK, TAP block for post op analgesia)        Anesthesia Quick Evaluation

## 2014-03-26 NOTE — H&P (View-Only) (Signed)
History of Present Illness Mark Cantu(Mark Mcgurk MD; 03/10/2014 1:48 PM) Patient words: Evaluate for Right Inguinal Hernia.  The patient is a 63 year old male who presents for an evaluation of a hernia. The patient is a 63 year old male who is referred by Mark Cantu for evaluation of a right inguinal hernia. The patient states it's been there for approximately 20 years. He states become more symptomatic at this time. Patient states it's not reducible.  Patient has had no signs or symptoms of strangulation.   Other Problems Mark Cantu([place]; 03/10/2014 1:35 PM) Arthritis Back Pain Inguinal Hernia  Past Surgical History Mark Cantu([place]; 03/10/2014 1:35 PM) No pertinent past surgical history  Diagnostic Studies History Mark Cantu(Mark [place]; 03/10/2014 1:35 PM) Colonoscopy never  Allergies Mark Cantu(Mark [place]; 03/10/2014 1:35 PM) No Known Drug Allergies11/06/2013  Social History Mark Cantu(Mark [place]; 03/10/2014 1:35 PM) Alcohol use Recently quit alcohol use. Caffeine use Carbonated beverages, Coffee. Illicit drug use Uses weekly. Tobacco use Current every day smoker.  Family History Mark Cantu(Mark [place]; 03/10/2014 1:35 PM) Diabetes Mellitus Brother, Mother. Heart Disease Father. Hypertension Father.  Review of Systems Mark Cantu(Mark Heber MD; 03/10/2014 1:47 PM) General Not Present- Appetite Loss, Chills, Fatigue, Fever, Night Sweats, Weight Gain and Weight Loss. Skin Not Present- Change in Wart/Mole, Dryness, Hives, Jaundice, New Lesions, Non-Healing Wounds, Rash and Ulcer. HEENT Not Present- Earache, Hearing Loss, Hoarseness, Nose Bleed, Oral Ulcers, Ringing in the Ears, Seasonal Allergies, Sinus Pain, Sore Throat, Visual Disturbances, Wears glasses/contact lenses and Yellow Eyes. Respiratory Not Present- Bloody sputum, Chronic Cough, Difficulty Breathing, Snoring and Wheezing. Breast Not Present- Breast Mass, Breast Pain, Nipple Discharge and Skin Changes. Cardiovascular Present-  Leg Cramps. Not Present- Chest Pain, Difficulty Breathing Lying Down, Palpitations, Rapid Heart Rate, Shortness of Breath and Swelling of Extremities. Gastrointestinal Not Present- Abdominal Pain, Bloating, Bloody Stool, Change in Bowel Habits, Chronic diarrhea, Constipation, Difficulty Swallowing, Excessive gas, Gets full quickly at meals, Hemorrhoids, Indigestion, Nausea, Rectal Pain and Vomiting. Male Genitourinary Not Present- Blood in Urine, Change in Urinary Stream, Frequency, Impotence, Nocturia, Painful Urination, Urgency and Urine Leakage. Musculoskeletal Not Present- Back Pain, Joint Pain, Joint Stiffness, Muscle Pain, Muscle Weakness and Swelling of Extremities. Neurological Not Present- Decreased Memory, Fainting, Headaches, Numbness, Seizures, Tingling, Tremor, Trouble walking and Weakness. Psychiatric Not Present- Anxiety, Bipolar, Change in Sleep Pattern, Depression, Fearful and Frequent crying. Endocrine Not Present- Cold Intolerance, Excessive Hunger, Hair Changes, Heat Intolerance and New Diabetes. Hematology Not Present- Easy Bruising, Excessive bleeding, Gland problems, HIV and Persistent Infections.   Vitals Mark Cantu(Mark [place] MA; 03/10/2014 1:34 PM) 03/10/2014 1:33 PM Weight: 193.4 lb Height: 75in Body Surface Area: 2.15 m Body Mass Index: 24.17 kg/m Temp.: 97.29F(Temporal)  Pulse: 71 (Regular)  BP: 150/92 (Sitting, Left Arm, Standard)    Physical Exam Mark Cantu(Mark Brancato MD; 03/10/2014 1:49 PM) General Mental Status-Alert. General Appearance-Consistent with stated age. Hydration-Well hydrated. Voice-Normal.  Head and Neck Head-normocephalic, atraumatic with no lesions or palpable masses.  Chest and Lung Exam Chest and lung exam reveals -quiet, even and easy respiratory effort with no use of accessory muscles, normal resonance, no flatness or dullness, non-tender and normal tactile fremitus and on auscultation, normal breath sounds, no adventitious  sounds and normal vocal resonance. Inspection Chest Wall - Normal. Back - normal.  Cardiovascular Cardiovascular examination reveals -on palpation PMI is normal in location and amplitude, no palpable S3 or S4. Normal cardiac borders., normal heart sounds, regular rate and rhythm with no murmurs, carotid auscultation reveals no bruits and normal pedal  pulses bilaterally.  Abdomen Inspection Skin - Scar - no surgical scars. Hernias - Diastasis recti - Present. Inguinal hernia - Right - Incarcerated. Palpation/Percussion Normal exam - Soft, Non Tender, No Rebound tenderness, No Rigidity (guarding) and No hepatosplenomegaly. Auscultation Normal exam - Bowel sounds normal.    Assessment & Plan (Mark Elizondo MD; 03/10/2014 1:50 PM) INGUINAL HERNIA (550.90  K40.90) Impression: 62-year-old male with a right incarcerated inguinal hernia. The patient will like to proceed to the operating room for open right inguinal hernia repair with mesh. All risks and benefits were discussed with the patient to generally include, but not limited to: infection, bleeding, damage to surrounding structures, acute and chronic nerve pain, and recurrence. Alternatives were offered and described. All questions were answered and the patient voiced understanding of the procedure and wishes to proceed at this point with hernia repair. Current Plans  Recommend surgery I discussed hernia surgery with the patient to include the risks and benefits to include, but not limited to: infection, bleeding, damage to surrounding structures, chronic wound healing, nerve pain, and possible recurrence. The patient voiced understanding and wishes to proceed at this time. 

## 2014-03-26 NOTE — Interval H&P Note (Signed)
History and Physical Interval Note:  03/26/2014 9:32 AM  Mark Cantu  has presented today for surgery, with the diagnosis of right inguinal hernia  The various methods of treatment have been discussed with the patient and family. After consideration of risks, benefits and other options for treatment, the patient has consented to  Procedure(s): OPEN RIGHT INGUINAL HERNOA REPAIR WITH MESH (Right) INSERTION OF MESH (N/A) as a surgical intervention .  The patient's history has been reviewed, patient examined, no change in status, stable for surgery.  I have reviewed the patient's chart and labs.  Questions were answered to the patient's satisfaction.     Marigene Ehlersamirez Jr., Jed LimerickArmando

## 2014-03-28 ENCOUNTER — Encounter (HOSPITAL_COMMUNITY): Payer: Self-pay | Admitting: General Surgery

## 2014-11-17 ENCOUNTER — Emergency Department (HOSPITAL_COMMUNITY): Payer: Medicare Other

## 2014-11-17 ENCOUNTER — Encounter (HOSPITAL_COMMUNITY): Payer: Self-pay | Admitting: Emergency Medicine

## 2014-11-17 ENCOUNTER — Inpatient Hospital Stay (HOSPITAL_COMMUNITY)
Admission: EM | Admit: 2014-11-17 | Discharge: 2014-11-20 | DRG: 175 | Disposition: A | Discharge status: Home / Self Care | Payer: Medicare Other | Attending: Internal Medicine | Admitting: Internal Medicine

## 2014-11-17 DIAGNOSIS — Z7951 Long term (current) use of inhaled steroids: Secondary | ICD-10-CM

## 2014-11-17 DIAGNOSIS — Z79899 Other long term (current) drug therapy: Secondary | ICD-10-CM

## 2014-11-17 DIAGNOSIS — Z79891 Long term (current) use of opiate analgesic: Secondary | ICD-10-CM

## 2014-11-17 DIAGNOSIS — R0602 Shortness of breath: Secondary | ICD-10-CM

## 2014-11-17 DIAGNOSIS — R609 Edema, unspecified: Secondary | ICD-10-CM

## 2014-11-17 DIAGNOSIS — J9601 Acute respiratory failure with hypoxia: Secondary | ICD-10-CM | POA: Diagnosis present

## 2014-11-17 DIAGNOSIS — F319 Bipolar disorder, unspecified: Secondary | ICD-10-CM | POA: Diagnosis present

## 2014-11-17 DIAGNOSIS — M1990 Unspecified osteoarthritis, unspecified site: Secondary | ICD-10-CM | POA: Diagnosis present

## 2014-11-17 DIAGNOSIS — I10 Essential (primary) hypertension: Secondary | ICD-10-CM | POA: Diagnosis present

## 2014-11-17 DIAGNOSIS — I2699 Other pulmonary embolism without acute cor pulmonale: Principal | ICD-10-CM

## 2014-11-17 DIAGNOSIS — N390 Urinary tract infection, site not specified: Secondary | ICD-10-CM

## 2014-11-17 DIAGNOSIS — R911 Solitary pulmonary nodule: Secondary | ICD-10-CM | POA: Diagnosis present

## 2014-11-17 DIAGNOSIS — I071 Rheumatic tricuspid insufficiency: Secondary | ICD-10-CM | POA: Diagnosis present

## 2014-11-17 DIAGNOSIS — F1010 Alcohol abuse, uncomplicated: Secondary | ICD-10-CM | POA: Diagnosis present

## 2014-11-17 DIAGNOSIS — F17210 Nicotine dependence, cigarettes, uncomplicated: Secondary | ICD-10-CM | POA: Diagnosis present

## 2014-11-17 HISTORY — DX: Essential (primary) hypertension: I10

## 2014-11-17 LAB — CBC WITH DIFFERENTIAL/PLATELET
Basophils Absolute: 0 10*3/uL (ref 0.0–0.1)
Basophils Relative: 0 % (ref 0–1)
Eosinophils Absolute: 0 10*3/uL (ref 0.0–0.7)
Eosinophils Relative: 0 % (ref 0–5)
HCT: 42.7 % (ref 39.0–52.0)
HEMOGLOBIN: 14.2 g/dL (ref 13.0–17.0)
Lymphocytes Relative: 10 % — ABNORMAL LOW (ref 12–46)
Lymphs Abs: 2.1 10*3/uL (ref 0.7–4.0)
MCH: 29.5 pg (ref 26.0–34.0)
MCHC: 33.3 g/dL (ref 30.0–36.0)
MCV: 88.6 fL (ref 78.0–100.0)
MONO ABS: 1.9 10*3/uL — AB (ref 0.1–1.0)
Monocytes Relative: 9 % (ref 3–12)
NEUTROS PCT: 81 % — AB (ref 43–77)
Neutro Abs: 17 10*3/uL — ABNORMAL HIGH (ref 1.7–7.7)
PLATELETS: 247 10*3/uL (ref 150–400)
RBC: 4.82 MIL/uL (ref 4.22–5.81)
RDW: 16.5 % — ABNORMAL HIGH (ref 11.5–15.5)
WBC: 20.9 10*3/uL — AB (ref 4.0–10.5)

## 2014-11-17 LAB — I-STAT CG4 LACTIC ACID, ED: Lactic Acid, Venous: 2.97 mmol/L (ref 0.5–2.0)

## 2014-11-17 LAB — I-STAT TROPONIN, ED: TROPONIN I, POC: 0.07 ng/mL (ref 0.00–0.08)

## 2014-11-17 LAB — D-DIMER, QUANTITATIVE: D-Dimer, Quant: 0.69 ug/mL-FEU — ABNORMAL HIGH (ref 0.00–0.48)
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
## 2014-11-17 NOTE — ED Notes (Signed)
Pt states he has had shortness of breath for the past 5 weeks that has progressively gotten worse   Denies pain

## 2014-11-18 ENCOUNTER — Emergency Department (HOSPITAL_COMMUNITY): Payer: Medicare Other

## 2014-11-18 ENCOUNTER — Inpatient Hospital Stay (HOSPITAL_COMMUNITY): Payer: Medicare Other

## 2014-11-18 ENCOUNTER — Encounter (HOSPITAL_COMMUNITY): Payer: Self-pay

## 2014-11-18 ENCOUNTER — Ambulatory Visit (HOSPITAL_COMMUNITY): Payer: Medicare Other

## 2014-11-18 DIAGNOSIS — I10 Essential (primary) hypertension: Secondary | ICD-10-CM

## 2014-11-18 DIAGNOSIS — N390 Urinary tract infection, site not specified: Secondary | ICD-10-CM | POA: Diagnosis present

## 2014-11-18 DIAGNOSIS — I2699 Other pulmonary embolism without acute cor pulmonale: Secondary | ICD-10-CM

## 2014-11-18 DIAGNOSIS — J9601 Acute respiratory failure with hypoxia: Secondary | ICD-10-CM | POA: Diagnosis not present

## 2014-11-18 DIAGNOSIS — Z79899 Other long term (current) drug therapy: Secondary | ICD-10-CM | POA: Diagnosis not present

## 2014-11-18 DIAGNOSIS — R911 Solitary pulmonary nodule: Secondary | ICD-10-CM | POA: Diagnosis present

## 2014-11-18 DIAGNOSIS — F1010 Alcohol abuse, uncomplicated: Secondary | ICD-10-CM | POA: Diagnosis not present

## 2014-11-18 DIAGNOSIS — R609 Edema, unspecified: Secondary | ICD-10-CM

## 2014-11-18 DIAGNOSIS — Z7951 Long term (current) use of inhaled steroids: Secondary | ICD-10-CM | POA: Diagnosis not present

## 2014-11-18 DIAGNOSIS — F17210 Nicotine dependence, cigarettes, uncomplicated: Secondary | ICD-10-CM | POA: Diagnosis present

## 2014-11-18 DIAGNOSIS — F319 Bipolar disorder, unspecified: Secondary | ICD-10-CM | POA: Diagnosis present

## 2014-11-18 DIAGNOSIS — I071 Rheumatic tricuspid insufficiency: Secondary | ICD-10-CM | POA: Diagnosis present

## 2014-11-18 DIAGNOSIS — Z79891 Long term (current) use of opiate analgesic: Secondary | ICD-10-CM | POA: Diagnosis not present

## 2014-11-18 DIAGNOSIS — R0602 Shortness of breath: Secondary | ICD-10-CM | POA: Diagnosis present

## 2014-11-18 DIAGNOSIS — M1990 Unspecified osteoarthritis, unspecified site: Secondary | ICD-10-CM | POA: Diagnosis present

## 2014-11-18 LAB — BASIC METABOLIC PANEL
ANION GAP: 9 (ref 5–15)
Anion gap: 11 (ref 5–15)
BUN: 13 mg/dL (ref 6–20)
BUN: 14 mg/dL (ref 6–20)
CALCIUM: 8.2 mg/dL — AB (ref 8.9–10.3)
CHLORIDE: 101 mmol/L (ref 101–111)
CO2: 21 mmol/L — ABNORMAL LOW (ref 22–32)
CO2: 21 mmol/L — ABNORMAL LOW (ref 22–32)
Calcium: 8.7 mg/dL — ABNORMAL LOW (ref 8.9–10.3)
Chloride: 103 mmol/L (ref 101–111)
Creatinine, Ser: 1.15 mg/dL (ref 0.61–1.24)
Creatinine, Ser: 1.04 mg/dL (ref 0.61–1.24)
GFR calc Af Amer: >60 mL/min (ref >60)
Glucose, Bld: 100 mg/dL — ABNORMAL HIGH (ref 65–99)
Glucose, Bld: 92 mg/dL (ref 65–99)
Potassium: 4.7 mmol/L (ref 3.5–5.1)
Potassium: 4.2 mmol/L (ref 3.5–5.1)
SODIUM: 133 mmol/L — AB (ref 135–145)
Sodium: 133 mmol/L — ABNORMAL LOW (ref 135–145)

## 2014-11-18 LAB — MRSA PCR SCREENING: MRSA BY PCR: NEGATIVE

## 2014-11-18 LAB — CG4 I-STAT (LACTIC ACID): Lactic Acid, Venous: 2.18 mmol/L (ref 0.5–2.0)

## 2014-11-18 LAB — HEPATIC FUNCTION PANEL
ALT: 42 U/L (ref 17–63)
AST: 62 U/L — ABNORMAL HIGH (ref 15–41)
Albumin: 3.3 g/dL — ABNORMAL LOW (ref 3.5–5.0)
Alkaline Phosphatase: 77 U/L (ref 38–126)
BILIRUBIN DIRECT: 0.5 mg/dL (ref 0.1–0.5)
Indirect Bilirubin: 1.4 mg/dL — ABNORMAL HIGH (ref 0.3–0.9)
TOTAL PROTEIN: 5.9 g/dL — AB (ref 6.5–8.1)
Total Bilirubin: 1.9 mg/dL — ABNORMAL HIGH (ref 0.3–1.2)

## 2014-11-18 LAB — URINALYSIS, ROUTINE W REFLEX MICROSCOPIC
BILIRUBIN URINE: NEGATIVE
Glucose, UA: NEGATIVE mg/dL
Ketones, ur: NEGATIVE mg/dL
Nitrite: NEGATIVE
PH: 6 (ref 5.0–8.0)
Protein, ur: NEGATIVE mg/dL
SPECIFIC GRAVITY, URINE: 1.004 — AB (ref 1.005–1.030)
Urobilinogen, UA: 1 mg/dL (ref 0.0–1.0)

## 2014-11-18 LAB — CBC
HCT: 44 % (ref 39.0–52.0)
Hemoglobin: 14.8 g/dL (ref 13.0–17.0)
MCH: 29.7 pg (ref 26.0–34.0)
MCHC: 33.6 g/dL (ref 30.0–36.0)
MCV: 88.2 fL (ref 78.0–100.0)
Platelets: 215 10*3/uL (ref 150–400)
RBC: 4.99 MIL/uL (ref 4.22–5.81)
RDW: 16.6 % — AB (ref 11.5–15.5)
WBC: 19.6 10*3/uL — ABNORMAL HIGH (ref 4.0–10.5)

## 2014-11-18 LAB — PROTIME-INR
INR: 1.62 — ABNORMAL HIGH (ref 0.00–1.49)
Prothrombin Time: 19.3 seconds — ABNORMAL HIGH (ref 11.6–15.2)

## 2014-11-18 LAB — CBC WITH DIFFERENTIAL/PLATELET
BASOS ABS: 0 10*3/uL (ref 0.0–0.1)
BASOS PCT: 0 % (ref 0–1)
EOS ABS: 0 10*3/uL (ref 0.0–0.7)
EOS PCT: 0 % (ref 0–5)
HCT: 39.1 % (ref 39.0–52.0)
Hemoglobin: 13.5 g/dL (ref 13.0–17.0)
LYMPHS ABS: 2.2 10*3/uL (ref 0.7–4.0)
LYMPHS PCT: 12 % (ref 12–46)
MCH: 30.2 pg (ref 26.0–34.0)
MCHC: 34.5 g/dL (ref 30.0–36.0)
MCV: 87.5 fL (ref 78.0–100.0)
MONOS PCT: 10 % (ref 3–12)
Monocytes Absolute: 1.8 10*3/uL — ABNORMAL HIGH (ref 0.1–1.0)
Neutro Abs: 14.1 10*3/uL — ABNORMAL HIGH (ref 1.7–7.7)
Neutrophils Relative %: 78 % — ABNORMAL HIGH (ref 43–77)
PLATELETS: 217 10*3/uL (ref 150–400)
RBC: 4.47 MIL/uL (ref 4.22–5.81)
RDW: 16.7 % — AB (ref 11.5–15.5)
WBC: 18.2 10*3/uL — AB (ref 4.0–10.5)

## 2014-11-18 LAB — RAPID URINE DRUG SCREEN, HOSP PERFORMED
Amphetamines: NOT DETECTED
Barbiturates: NOT DETECTED
Benzodiazepines: NOT DETECTED
Cocaine: NOT DETECTED
OPIATES: NOT DETECTED
TETRAHYDROCANNABINOL: NOT DETECTED

## 2014-11-18 LAB — HEPARIN LEVEL (UNFRACTIONATED)
HEPARIN UNFRACTIONATED: 0.49 [IU]/mL (ref 0.30–0.70)
Heparin Unfractionated: 0.2 IU/mL — ABNORMAL LOW (ref 0.30–0.70)
Heparin Unfractionated: 0.27 IU/mL — ABNORMAL LOW (ref 0.30–0.70)

## 2014-11-18 LAB — BRAIN NATRIURETIC PEPTIDE: B Natriuretic Peptide: 1256.4 pg/mL — ABNORMAL HIGH (ref 0.0–100.0)

## 2014-11-18 LAB — TROPONIN I
TROPONIN I: 0.05 ng/mL — AB (ref <0.031)
Troponin I: 0.08 ng/mL — ABNORMAL HIGH (ref <0.031)
Troponin I: 0.06 ng/mL — ABNORMAL HIGH (ref <0.031)

## 2014-11-18 LAB — APTT: APTT: 34 s (ref 24–37)

## 2014-11-18 LAB — URINE MICROSCOPIC-ADD ON

## 2014-11-18 MED ORDER — WARFARIN SODIUM 5 MG PO TABS: 7.5000 mg | ORAL_TABLET | Freq: Once | ORAL | Status: DC

## 2014-11-18 MED ORDER — LORAZEPAM 1 MG PO TABS
1.0000 mg | ORAL_TABLET | Freq: Four times a day (QID) | ORAL | Status: DC | PRN
  Administered 2014-11-18: 1 mg via ORAL
  Filled 2014-11-18: qty 1

## 2014-11-18 MED ORDER — LORAZEPAM 1 MG PO TABS
0.0000 mg | ORAL_TABLET | Freq: Four times a day (QID) | ORAL | Status: AC
  Administered 2014-11-18: 1 mg via ORAL
  Filled 2014-11-18: qty 1

## 2014-11-18 MED ORDER — IOHEXOL 300 MG/ML  SOLN: 100.0000 mL | Freq: Once | INTRAMUSCULAR | Status: DC | PRN

## 2014-11-18 MED ORDER — FOLIC ACID 1 MG PO TABS
1.0000 mg | ORAL_TABLET | Freq: Every day | ORAL | Status: DC
  Administered 2014-11-18 – 2014-11-20 (×3): 1 mg via ORAL
  Filled 2014-11-18 (×3): qty 1

## 2014-11-18 MED ORDER — VITAMIN B-1 100 MG PO TABS
100.0000 mg | ORAL_TABLET | Freq: Every day | ORAL | Status: DC
  Administered 2014-11-18 – 2014-11-20 (×3): 100 mg via ORAL
  Filled 2014-11-18 (×3): qty 1

## 2014-11-18 MED ORDER — ONDANSETRON HCL 4 MG/2ML IJ SOLN: 4.0000 mg | Freq: Four times a day (QID) | INTRAMUSCULAR | Status: DC | PRN

## 2014-11-18 MED ORDER — IOHEXOL 350 MG/ML SOLN
100.0000 mL | Freq: Once | INTRAVENOUS | Status: AC | PRN
  Administered 2014-11-18: 100 mL via INTRAVENOUS

## 2014-11-18 MED ORDER — ACETAMINOPHEN 650 MG RE SUPP: 650.0000 mg | Freq: Four times a day (QID) | RECTAL | Status: DC | PRN

## 2014-11-18 MED ORDER — ACETAMINOPHEN 325 MG PO TABS: 650.0000 mg | ORAL_TABLET | Freq: Four times a day (QID) | ORAL | Status: DC | PRN

## 2014-11-18 MED ORDER — DEXTROSE 5 % IV SOLN
1.0000 g | Freq: Once | INTRAVENOUS | Status: AC
  Administered 2014-11-18: 1 g via INTRAVENOUS
  Filled 2014-11-18: qty 10

## 2014-11-18 MED ORDER — HEPARIN BOLUS VIA INFUSION
3000.0000 [IU] | Freq: Once | INTRAVENOUS | Status: DC
  Filled 2014-11-18: qty 3000

## 2014-11-18 MED ORDER — HEPARIN (PORCINE) IN NACL 100-0.45 UNIT/ML-% IJ SOLN
1700.0000 [IU]/h | INTRAMUSCULAR | Status: DC
  Administered 2014-11-18: 1700 [IU]/h via INTRAVENOUS
  Administered 2014-11-18: 1550 [IU]/h via INTRAVENOUS
  Administered 2014-11-19: 1700 [IU]/h via INTRAVENOUS
  Filled 2014-11-18 (×4): qty 250

## 2014-11-18 MED ORDER — HYDRALAZINE HCL 20 MG/ML IJ SOLN
5.0000 mg | Freq: Four times a day (QID) | INTRAMUSCULAR | Status: DC | PRN
  Administered 2014-11-18: 5 mg via INTRAVENOUS
  Filled 2014-11-18: qty 1

## 2014-11-18 MED ORDER — WARFARIN SODIUM 5 MG PO TABS
5.0000 mg | ORAL_TABLET | Freq: Once | ORAL | Status: AC
  Administered 2014-11-18: 5 mg via ORAL
  Filled 2014-11-18: qty 1

## 2014-11-18 MED ORDER — SODIUM CHLORIDE 0.9 % IV SOLN: INTRAVENOUS | Status: AC

## 2014-11-18 MED ORDER — WARFARIN - PHARMACIST DOSING INPATIENT: Freq: Every day | Status: DC

## 2014-11-18 MED ORDER — LORAZEPAM 2 MG/ML IJ SOLN: 1.0000 mg | Freq: Four times a day (QID) | INTRAMUSCULAR | Status: DC | PRN

## 2014-11-18 MED ORDER — DEXTROSE 5 % IV SOLN
1.0000 g | INTRAVENOUS | Status: DC
  Administered 2014-11-18 – 2014-11-20 (×3): 1 g via INTRAVENOUS
  Filled 2014-11-18 (×3): qty 10

## 2014-11-18 MED ORDER — THIAMINE HCL 100 MG/ML IJ SOLN
100.0000 mg | Freq: Every day | INTRAMUSCULAR | Status: DC
  Filled 2014-11-18 (×2): qty 2

## 2014-11-18 MED ORDER — ADULT MULTIVITAMIN W/MINERALS CH
1.0000 | ORAL_TABLET | Freq: Every day | ORAL | Status: DC
  Administered 2014-11-18 – 2014-11-20 (×3): 1 via ORAL
  Filled 2014-11-18 (×3): qty 1

## 2014-11-18 MED ORDER — LORAZEPAM 1 MG PO TABS: 0.0000 mg | ORAL_TABLET | Freq: Two times a day (BID) | ORAL | Status: DC

## 2014-11-18 MED ORDER — ONDANSETRON HCL 4 MG PO TABS: 4.0000 mg | ORAL_TABLET | Freq: Four times a day (QID) | ORAL | Status: DC | PRN

## 2014-11-18 NOTE — ED Notes (Signed)
Pt with a coughing spell, O2 sats down to 84%; pt with productive cough with thick white sputum; pt with increased shortness of breath after coughing episode; took pt several minutes for O2 sats to come up 94%on 4lpm via [place].
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
## 2014-11-18 NOTE — Progress Notes (Signed)
ANTICOAGULATION CONSULT NOTE - follow up  Pharmacy Consult for Heparin Indication: pulmonary embolus  No Known Allergies  Patient Measurements: Height: 6\' 3"  (190.5 cm) Weight: 191 lb 12.8 oz (87 kg) IBW/kg (Calculated) : 84.5 Heparin Dosing Weight:   Vital Signs: Temp: 97.5 F (36.4 C) (07/12 1934) Temp Source: Oral (07/12 1934) BP: 137/98 mmHg (07/12 1800) Pulse Rate: 94 (07/12 1900)  Labs:  Recent Labs  11/17/14 2235  11/17/14 2252 11/18/14 0308 11/18/14 0549 11/18/14 0550 11/18/14 1135 11/18/14 1900  HGB  --   < > 14.2 14.8  --  13.5  --   --   HCT  --   --  42.7 44.0  --  39.1  --   --   PLT  --   --  247 215  --  217  --   --   APTT 34  --   --   --   --   --   --   --   LABPROT 19.3*  --   --   --   --   --   --   --   INR 1.62*  --   --   --   --   --   --   --   HEPARINUNFRC  --   --   --   --  0.20*  --  0.27* 0.49  CREATININE  --   --  1.15  --   --  1.04  --   --   TROPONINI  --   --   --   --   --  0.08* 0.05*  --   < > = values in this interval not displayed.  Estimated Creatinine Clearance: 84.6 mL/min (by C-G formula based on Cr of 1.04).   Medical History: Past Medical History  Diagnosis Date  . Arthritis   . Bipolar disorder     at one time, i was diagnosed with bipolar, but he takes no meds  . Hypertension     Medications:  Infusions:  . sodium chloride    . heparin 1,700 Units/hr (11/18/14 1900)    Assessment: Patient with new PE in ED for SOB. MD wishes for pharmacy to dose heparin for PE.  No oral anticoagulants noted on med rec., hx of alcohol abuse, INR >1.5 on admit, will not give heparin bolus.  + Warfarin per Rx.   11/18/2014  HL 0.27 (subtherapeutic) H/H WNL Plts WNL Scr 1.0, CrCl ~4785mls/min 1900 HL=0.49, no problems reported Baseline INR=1.62/Alb=3.3  Goal of Therapy:  Heparin level 0.3-0.7 units/ml Monitor platelets by anticoagulation protocol: Yes  INR=2-3   Plan:   Continue heparin drip @ 1700  units/hr  Warfarin 5mg  x1 tonight  Daily HL and recheck HL with am labs    Lorenza EvangelistGreen, Vitaly Wanat R 11/18/2014, 8:02 PM

## 2014-11-18 NOTE — Progress Notes (Signed)
*  PRELIMINARY RESULTS* Echocardiogram 2D Echocardiogram has been performed.  Jeryl Columbialliott, Zahki Hoogendoorn 11/18/2014, 9:38 AM

## 2014-11-18 NOTE — Progress Notes (Signed)
Patient's wallet was removed from his bedside by his Niece Theodoro DoingFelicia Troiano, to be taken home.

## 2014-11-18 NOTE — Progress Notes (Signed)
*  PRELIMINARY RESULTS* Vascular Ultrasound Lower extremity venous duplex has been completed.  Preliminary findings: negative for DVT.   Farrel DemarkJill Eunice, RDMS, RVT  11/18/2014, 9:41 AM

## 2014-11-18 NOTE — Progress Notes (Signed)
Utilization review completed.  

## 2014-11-18 NOTE — Progress Notes (Signed)
ANTICOAGULATION CONSULT NOTE - follow up  Pharmacy Consult for Heparin Indication: pulmonary embolus  No Known Allergies  Patient Measurements: Height: 6\' 3"  (190.5 cm) Weight: 191 lb 12.8 oz (87 kg) IBW/kg (Calculated) : 84.5 Heparin Dosing Weight:   Vital Signs: Temp: 98.3 F (36.8 C) (07/12 1200) Temp Source: Oral (07/12 1200) BP: 128/94 mmHg (07/12 1100) Pulse Rate: 92 (07/12 1100)  Labs:  Recent Labs  11/17/14 2235  11/17/14 2252 11/18/14 0308 11/18/14 0549 11/18/14 0550 11/18/14 1135  HGB  --   < > 14.2 14.8  --  13.5  --   HCT  --   --  42.7 44.0  --  39.1  --   PLT  --   --  247 215  --  217  --   APTT 34  --   --   --   --   --   --   LABPROT 19.3*  --   --   --   --   --   --   INR 1.62*  --   --   --   --   --   --   HEPARINUNFRC  --   --   --   --  0.20*  --  0.27*  CREATININE  --   --  1.15  --   --  1.04  --   TROPONINI  --   --   --   --   --  0.08* 0.05*  < > = values in this interval not displayed.  Estimated Creatinine Clearance: 84.6 mL/min (by C-G formula based on Cr of 1.04).   Medical History: Past Medical History  Diagnosis Date  . Arthritis   . Bipolar disorder     at one time, i was diagnosed with bipolar, but he takes no meds  . Hypertension     Medications:  Infusions:  . sodium chloride    . heparin 1,550 Units/hr (11/18/14 0256)    Assessment: Patient with new PE in ED for SOB. MD wishes for pharmacy to dose heparin for PE.  No oral anticoagulants noted on med rec., hx of alcohol abuse, INR >1.5 on admit, will not give heparin bolus.  11/18/2014  HL 0.27 (subtherapeutic) H/H WNL Plts WNL Scr 1.0, CrCl ~1685mls/min  Goal of Therapy:  Heparin level 0.3-0.7 units/ml Monitor platelets by anticoagulation protocol: Yes   Plan:  Increase Heparin drip to 1700 units/hr Daily CBC Next heparin level at 1900    Arley PhenixEllen Ankush Gintz RPh 11/18/2014, 12:53 PM Pager [phone]

## 2014-11-18 NOTE — Progress Notes (Signed)
64 year old male with hypertension, alcohol abuse presented to ED with SOB, was found to have bilateral PE, started on IV heparin.  Patient was admitted earlier this am, for detailed H&P please see Dr Katherene PontoKakrakandy's note earlier today.   On EXAM Patient is alert afebrile comfortable, denies any new complaints.  CVS s1s2 Lungs clear, no wheezing or rhonchi Abdomen: soft non tender non distended bowel sounds heard.  Extremities: no pedal edema.   Plan: IV heparin for anticoagulation, LE venous duplex, and echocardiogram.  IV rocephin for UTI.  CIWA protocol for alcohol withdrawal.  Anti hypertensives for accelerated hypertension.   Kathlen ModyVijaya Dearia Wilmouth MD [phone]

## 2014-11-18 NOTE — Progress Notes (Addendum)
ANTICOAGULATION CONSULT NOTE - Initial Consult  Pharmacy Consult for Heparin Indication: pulmonary embolus  No Known Allergies  Patient Measurements: Height: 6\' 3"  (190.5 cm) Weight: 190 lb (86.183 kg) IBW/kg (Calculated) : 84.5 Heparin Dosing Weight:   Vital Signs: Temp: 99 F (37.2 C) (07/11 2341) Temp Source: Rectal (07/11 2341) BP: 129/92 mmHg (07/12 0216) Pulse Rate: 87 (07/12 0216)  Labs:  Recent Labs  11/17/14 2252  HGB 14.2  HCT 42.7  PLT 247  CREATININE 1.15    Estimated Creatinine Clearance: 76.5 mL/min (by C-G formula based on Cr of 1.15).   Medical History: Past Medical History  Diagnosis Date  . Arthritis   . Bipolar disorder     at one time, i was diagnosed with bipolar, but he takes no meds  . Hypertension     Medications:  Infusions:  . heparin      Assessment: Patient with new PE in ED for SOB. MD wishes for pharmacy to dose heparin for PE.  No oral anticoagulants noted on med rec. INR >1.5 on admit, will not give heparin bolus.  Goal of Therapy:  Heparin level 0.3-0.7 units/ml Monitor platelets by anticoagulation protocol: Yes   Plan:  Heparin drip at 1550 units/hr Daily CBC Next heparin level at 1200    315 Baker RoadGrimsley Jr, YumaJulian Crowford 11/18/2014,2:24 AM

## 2014-11-19 DIAGNOSIS — N390 Urinary tract infection, site not specified: Secondary | ICD-10-CM

## 2014-11-19 DIAGNOSIS — J9601 Acute respiratory failure with hypoxia: Secondary | ICD-10-CM

## 2014-11-19 DIAGNOSIS — F1010 Alcohol abuse, uncomplicated: Secondary | ICD-10-CM

## 2014-11-19 DIAGNOSIS — I2699 Other pulmonary embolism without acute cor pulmonale: Principal | ICD-10-CM

## 2014-11-19 LAB — CBC
HCT: 39.8 % (ref 39.0–52.0)
Hemoglobin: 13 g/dL (ref 13.0–17.0)
MCH: 29.3 pg (ref 26.0–34.0)
MCHC: 32.7 g/dL (ref 30.0–36.0)
MCV: 89.6 fL (ref 78.0–100.0)
PLATELETS: 208 10*3/uL (ref 150–400)
RBC: 4.44 MIL/uL (ref 4.22–5.81)
RDW: 16.7 % — ABNORMAL HIGH (ref 11.5–15.5)
WBC: 11.8 10*3/uL — ABNORMAL HIGH (ref 4.0–10.5)

## 2014-11-19 LAB — PROTIME-INR
INR: 1.65 — ABNORMAL HIGH (ref 0.00–1.49)
Prothrombin Time: 19.5 seconds — ABNORMAL HIGH (ref 11.6–15.2)

## 2014-11-19 LAB — BASIC METABOLIC PANEL
Anion gap: 7 (ref 5–15)
BUN: 18 mg/dL (ref 6–20)
CALCIUM: 8.5 mg/dL — AB (ref 8.9–10.3)
CO2: 24 mmol/L (ref 22–32)
Chloride: 106 mmol/L (ref 101–111)
Creatinine, Ser: 0.92 mg/dL (ref 0.61–1.24)
GFR calc Af Amer: >60 mL/min (ref >60)
GFR calc non Af Amer: >60 mL/min (ref >60)
Glucose, Bld: 94 mg/dL (ref 65–99)
POTASSIUM: 4.1 mmol/L (ref 3.5–5.1)
Sodium: 137 mmol/L (ref 135–145)

## 2014-11-19 LAB — HEPARIN LEVEL (UNFRACTIONATED): Heparin Unfractionated: 0.45 IU/mL (ref 0.30–0.70)

## 2014-11-19 MED ORDER — RIVAROXABAN 15 MG PO TABS
15.0000 mg | ORAL_TABLET | Freq: Two times a day (BID) | ORAL | Status: DC
  Administered 2014-11-19 – 2014-11-20 (×3): 15 mg via ORAL
  Filled 2014-11-19 (×3): qty 1

## 2014-11-19 MED ORDER — WARFARIN VIDEO
Freq: Once | Status: AC
  Administered 2014-11-19: 15:00:00

## 2014-11-19 MED ORDER — WARFARIN SODIUM 5 MG PO TABS: 5.0000 mg | ORAL_TABLET | Freq: Once | ORAL | Status: DC

## 2014-11-19 MED ORDER — PATIENT'S GUIDE TO USING COUMADIN BOOK
Freq: Once | Status: AC
  Administered 2014-11-19: 12:00:00
  Filled 2014-11-19: qty 1

## 2014-11-19 NOTE — Care Management Note (Addendum)
Case Management Note  Patient Details  Name: Mark Cantu MRN: [id number] Date of Birth: Dec 26, 1950  Subjective/Objective:             64 yo admitted with pulmonary emboli       Action/Plan: From home   Expected Discharge Date:   (unknown)               Expected Discharge Plan:  Home/Self Care  In-House Referral:     Discharge planning Services  CM Consult, Medication Assistance  Post Acute Care Choice:    Choice offered to:     DME Arranged:    DME Agency:     HH Arranged:    HH Agency:     Status of Service:  In process, will continue to follow  Medicare Important Message Given:    Date Medicare IM Given:    Medicare IM give by:    Date Additional Medicare IM Given:    Additional Medicare Important Message give by:     If discussed at Long Length of Stay Meetings, dates discussed:    Additional Comments: CM consult for home [employer] pricing. Pt only with Medicare A and B with no prescription medication coverage. Pt [facility]. This CM called Xeralto rep for help with pt receiving medication after 30 day free trial card. This CM also [facility] and [facility] to see if they have samples or Xeralto assistance. Per MD pt will need Xeralto 4-6 months. This CM spoke with [facility] outpt pharmacy who confirms they have starter pack in stock. This CM is awaiting return phone calls. Xeralto 30 day card given to pt and will continue to follow.  1646- Received call back from [facility](Matt Vassie LollCurran [phone]) who states that [facility] the resources available to help pt maintain on Xerelto after the 30 day free trial. He would need to have a follow up appointment there at [facility] and would receive resources at that time per rep.  This information was given to MD as well. CM will continue to follow. Bartholome BillCLEMENTS, Derel Mcglasson H, RN 11/19/2014, 4:05 PM

## 2014-11-19 NOTE — Progress Notes (Addendum)
Protocol: Xarelto Indication: PE  Assessment: Pt well known to pharmacy due to Heparin and Warfarin therapy. Both drugs to be d/ced this afternoon and start on Xarelto. CLcr=96 ml/min, H/H 13/39.8, PLT= 208  Plan: Will d/c Heparin infusion and start Xarelto 15mg  po BID x 21 days then switch to 20mg  daily. D/C Warfarin D/C HL/INR labs F/u Scr  Thanks,  Dorethea ClanFrens, Amila Callies Ann, PharmD 11/19/2014

## 2014-11-19 NOTE — Progress Notes (Signed)
Pt ambulated around the unit. Pt requires 6 L O2 [place] to maintain O2 saturation between 91 to 94% with RR up to 33 and HR in the 110s.

## 2014-11-19 NOTE — Progress Notes (Addendum)
[facility] Progress Note   Douglas Smolinsky ZOX:[id number] DOB: [birth year] DOA: 11/17/2014 PCP: PROVIDER NOT IN SYSTEM  Brief narrative: Mark Cantu is a 64 y.o. male that alcohol abuse and arthritis who presents with 5 weeks of shortness of breath and is found to have pulmonary emboli.   Subjective: No alcohol withdrawal-specifically no tremors, anxiety, hallucinations or nausea. He is not short of breath at rest. Has not ambulated yet.  Assessment/Plan: Principal Problem:   Pulmonary emboli/acute hypoxic respiratory failure -Bilateral PE -possible infarct at the right lung base and possible nodule or infarct at left lung base-no chest pain -requiring oxygen to keep saturation greater than 88- assess for need for home O2 -Has a dilated RV (likely chronic) and mildly elevated troponin but no hypotension/hemodynamic instability, no chest pain and therefore, at this time no need for thrombolytics -We'll switch from heparin to Xarelto today-given a 30 day free card and Xarelto wrap this helping to decide if he will qualify for future Xarelto as he has no insurance to pay for medications -Case management also trying to see if the evidence PCP can provide samples-  -Lower extremity vascular Doppler negative for DVT -We'll need outpatient hypercoagulable workup once off of anticoagulation  Active Problems:   UTI (lower urinary tract infection) -Greater than 100,000 colonies of gram-negative rods--continue Rocephin  Dilated RV -Mention of pulmonary hypertension on 2-D echo - Recommend sleep study find underlying cause-no history of COPD  Severe LVH -Follow blood pressure as this may be the underlying cause-she is not on treatment for hypertension    Alcohol abuse -Drinks 3 40 ounce beers a day-states that he will stop this now-no signs of withdrawal    Appt with PCP: Requested Code Status: Full code Family Communication:  Disposition Plan: Home tomorrow-obtain PT eval and  screen for 02 DVT prophylaxis: Heparin infusion/Xarelto Consultants: Procedures: Venous Doppler's lower extremity  Antibiotics: Anti-infectives    Start     Dose/Rate Route Frequency Ordered Stop   11/18/14 2200  cefTRIAXone (ROCEPHIN) 1 g in dextrose 5 % 50 mL IVPB     1 g 100 mL/hr over 30 Minutes Intravenous Every 24 hours 11/18/14 0602     11/18/14 0045  cefTRIAXone (ROCEPHIN) 1 g in dextrose 5 % 50 mL IVPB     1 g 100 mL/hr over 30 Minutes Intravenous  Once 11/18/14 0039 11/18/14 0119      Objective: Filed Weights   11/18/14 0216 11/18/14 0700 11/19/14 0600  Weight: 86.183 kg (190 lb) 87 kg (191 lb 12.8 oz) 87.3 kg (192 lb 7.4 oz)    Intake/Output Summary (Last 24 hours) at 11/19/14 1614 Last data filed at 11/19/14 1300  Gross per 24 hour  Intake   1007 ml  Output   1750 ml  Net   -743 ml     Vitals Filed Vitals:   11/19/14 0800 11/19/14 0900 11/19/14 1000 11/19/14 1300  BP: 136/99  143/94   Pulse: 71 82 86   Temp: 97.6 F (36.4 C)   97.3 F (36.3 C)  TempSrc: Oral   Oral  Resp: Height:      Weight:      SpO2: 99% 88% 94%     Exam:  General:  Pt is alert, not in acute distress  HEENT: No icterus, No thrush, oral mucosa moist  Cardiovascular: regular rate and rhythm, S1/S2 No murmur  Respiratory: clear to auscultation bilaterally   Abdomen: Soft, +Bowel sounds, non tender, non distended,  no guarding  MSK: No LE edema, cyanosis or clubbing  Data Reviewed: Basic Metabolic Panel:  Recent Labs Lab 11/17/14 2252 11/18/14 0550 11/19/14 0330  NA 133* 133* 137  K 4.7 4.2 4.1  CL 103 101 106  CO2 21* 21* 24  GLUCOSE 100* 92 94  BUN 13 14 18   CREATININE 1.15 1.04 0.92  CALCIUM 8.7* 8.2* 8.5*   Liver Function Tests:  Recent Labs Lab 11/18/14 0550  AST 62*  ALT 42  ALKPHOS 77  BILITOT 1.9*  PROT 5.9*  ALBUMIN 3.3*   No results for input(s): LIPASE, AMYLASE in the last 168 hours. No results for input(s): AMMONIA in the last  168 hours. CBC:  Recent Labs Lab 11/17/14 2252 11/18/14 0308 11/18/14 0550 11/19/14 0330  WBC 20.9* 19.6* 18.2* 11.8*  NEUTROABS 17.0*  --  14.1*  --   HGB 14.2 14.8 13.5 13.0  HCT 42.7 44.0 39.1 39.8  MCV 88.6 88.2 87.5 89.6  PLT 247 215 217 208   Cardiac Enzymes:  Recent Labs Lab 11/18/14 0550 11/18/14 1135 11/18/14 1919  TROPONINI 0.08* 0.05* 0.06*   BNP (last 3 results)  Recent Labs  11/17/14 2252  BNP 1256.4*    ProBNP (last 3 results) No results for input(s): PROBNP in the last 8760 hours.  CBG: No results for input(s): GLUCAP in the last 168 hours.  Recent Results (from the past 240 hour(s))  Urine culture     Status: None (Preliminary result)   Collection Time: 11/18/14 12:53 AM  Result Value Ref Range Status   Specimen Description URINE, RANDOM  Final   Special Requests NONE  Final   Culture   Final    >=100,000 COLONIES/mL GRAM NEGATIVE RODS Performed at [facility]    Report Status PENDING  Incomplete  MRSA PCR Screening     Status: None   Collection Time: 11/18/14  6:56 AM  Result Value Ref Range Status   MRSA by PCR NEGATIVE NEGATIVE Final    Comment:        The GeneXpert MRSA Assay (FDA approved for NASAL specimens only), is one component of a comprehensive MRSA colonization surveillance program. It is not intended to diagnose MRSA infection nor to guide or monitor treatment for MRSA infections.      Studies: Dg Chest 2 View  11/17/2014   CLINICAL DATA:  64 year old male with shortness breath for the past 5 weeks progressively getting worse.  EXAM: CHEST  2 VIEW  COMPARISON:  No priors.  FINDINGS: Lung volumes are normal. No consolidative airspace disease. No pleural effusions. No pneumothorax. No pulmonary nodule or mass noted. Pulmonary vasculature and the cardiomediastinal silhouette are within normal limits. Atherosclerotic calcifications in the thoracic aorta.  IMPRESSION: 1. No radiographic evidence of acute  cardiopulmonary disease. 2. Atherosclerosis.   Electronically Signed   By: Trudie Reed M.D.   On: 11/17/2014 21:36   Ct Angio Chest Pe W/cm &/or Wo Cm  11/18/2014   CLINICAL DATA:  Shortness of breath for 5 weeks, worsening. Elevated D-dimer.  EXAM: CT ANGIOGRAPHY CHEST WITH CONTRAST  TECHNIQUE: Multidetector CT imaging of the chest was performed using the standard protocol during bolus administration of intravenous contrast. Multiplanar CT image reconstructions and MIPs were obtained to evaluate the vascular anatomy.  CONTRAST:  OMNIPAQUE IOHEXOL 350 MG/ML SOLN  COMPARISON:  Chest 11/17/2014  FINDINGS: Technically adequate study with good opacification of the central and segmental pulmonary arteries. Filling defects are demonstrated in the distal right main  pulmonary artery, extending into right upper lung, right middle lung, and right lower lung branches. Segmental and subsegmental emboli are demonstrated in the left lower lung. RV to LV ratio is 1.86, suggesting right heart strain. There is also reflux of contrast material noted into the hepatic veins.  Mild cardiac enlargement. Small pericardial effusion. Normal caliber thoracic aorta. Scattered lymph nodes in the mediastinum are not pathologically enlarged and are likely reactive. Esophagus is decompressed.  Infiltration in the right lung base may indicate pulmonary infarct. Focal nodule in the left lung base laterally measuring 14 mm. This could represent infarct or pulmonary nodule and followup after resolution of acute process is suggested to exclude significant pulmonary nodule. Patchy emphysematous changes in the lungs. No pneumothorax. No pleural effusions. Airways appear patent.  Included portions of the upper abdominal organs are grossly unremarkable. No destructive bone lesions.  Review of the MIP images confirms the above findings.  IMPRESSION: Examination is positive for bilateral pulmonary emboli. RV to LV ratio is 1.86 suggesting  right heart strain. Infiltration in the right lung base could indicate pneumonia or pulmonary infarct. Nonspecific 14 mm nodule in the left lung base could indicate small infarct but follow-up is suggested to exclude significant pulmonary nodule.  These results were called by telephone at the time of interpretation on 11/18/2014 at 1:29 am to [facility] , who verbally acknowledged these results.   Electronically Signed   By: Burman NievesWilliam  Stevens M.D.   On: 11/18/2014 01:31    Scheduled Meds:  Scheduled Meds: . cefTRIAXone (ROCEPHIN)  IV  1 g Intravenous Q24H  . folic acid  1 mg Oral Daily  . LORazepam  0-4 mg Oral Q6H   Followed by  . [START ON 11/20/2014] LORazepam  0-4 mg Oral Q12H  . multivitamin with minerals  1 tablet Oral Daily  . thiamine  100 mg Oral Daily   Or  . thiamine  100 mg Intravenous Daily  . warfarin  5 mg Oral Once  . Warfarin - Pharmacist Dosing Inpatient   Does not apply q1800   Continuous Infusions: . heparin 1,700 Units/hr (11/19/14 [id number])    Time spent on care of this patient: 35 min   Delmos Velaquez, MD 11/19/2014, 4:14 PM  LOS: 1 day   [facility] Office  [phone] Pager - Text Page per [email or web address] If 7PM-7AM, please contact night-coverage [email or web address]

## 2014-11-19 NOTE — Progress Notes (Signed)
ANTICOAGULATION CONSULT NOTE - Follow Up Consult  Pharmacy Consult for Heparin Indication: pulmonary embolus  No Known Allergies  Patient Measurements: Height: 6\' 3"  (190.5 cm) Weight: 191 lb 12.8 oz (87 kg) IBW/kg (Calculated) : 84.5 Heparin Dosing Weight:   Vital Signs: Temp: 98.6 F (37 C) (07/12 2258) Temp Source: Oral (07/12 2258) BP: 115/86 mmHg (07/13 0012) Pulse Rate: 76 (07/13 0012)  Labs:  Recent Labs  11/17/14 2235  11/17/14 2252 11/18/14 0308  11/18/14 0550 11/18/14 1135 11/18/14 1900 11/18/14 1919 11/19/14 0330  HGB  --   < > 14.2 14.8  --  13.5  --   --   --  13.0  HCT  --   < > 42.7 44.0  --  39.1  --   --   --  39.8  PLT  --   < > 247 215  --  217  --   --   --  208  APTT 34  --   --   --   --   --   --   --   --   --   LABPROT 19.3*  --   --   --   --   --   --   --   --  19.5*  INR 1.62*  --   --   --   --   --   --   --   --  1.65*  HEPARINUNFRC  --   --   --   --   < >  --  0.27* 0.49  --  0.45  CREATININE  --   --  1.15  --   --  1.04  --   --   --  0.92  TROPONINI  --   --   --   --   --  0.08* 0.05*  --  0.06*  --   < > = values in this interval not displayed.  Estimated Creatinine Clearance: 95.7 mL/min (by C-G formula based on Cr of 0.92).   Medications:  Infusions:  . sodium chloride    . heparin 1,700 Units/hr (11/19/14 0000)    Assessment: Patient with 2nd heparin level at goal.  No heparin issues noted.    Goal of Therapy:  Heparin level 0.3-0.7 units/ml Monitor platelets by anticoagulation protocol: Yes   Plan:  Continue heparin drip at current rate Recheck level with 7/14 AM labs  Aleene DavidsonGrimsley Jr, Renlee Floor Crowford 11/19/2014,5:01 AM

## 2014-11-19 NOTE — Progress Notes (Addendum)
ANTICOAGULATION CONSULT NOTE - follow up  Pharmacy Consult for Heparin Indication: pulmonary embolus  No Known Allergies  Patient Measurements: Height: 6\' 3"  (190.5 cm) Weight: 192 lb 7.4 oz (87.3 kg) IBW/kg (Calculated) : 84.5 Heparin Dosing Weight:   Vital Signs: Temp: 97.3 F (36.3 C) (07/13 1300) Temp Source: Oral (07/13 1300) BP: 143/94 mmHg (07/13 1000) Pulse Rate: 86 (07/13 1000)  Labs:  Recent Labs  11/17/14 2235  11/17/14 2252 11/18/14 0308  11/18/14 0550 11/18/14 1135 11/18/14 1900 11/18/14 1919 11/19/14 0330  HGB  --   < > 14.2 14.8  --  13.5  --   --   --  13.0  HCT  --   < > 42.7 44.0  --  39.1  --   --   --  39.8  PLT  --   < > 247 215  --  217  --   --   --  208  APTT 34  --   --   --   --   --   --   --   --   --   LABPROT 19.3*  --   --   --   --   --   --   --   --  19.5*  INR 1.62*  --   --   --   --   --   --   --   --  1.65*  HEPARINUNFRC  --   --   --   --   < >  --  0.27* 0.49  --  0.45  CREATININE  --   --  1.15  --   --  1.04  --   --   --  0.92  TROPONINI  --   --   --   --   --  0.08* 0.05*  --  0.06*  --   < > = values in this interval not displayed.  Estimated Creatinine Clearance: 95.7 mL/min (by C-G formula based on Cr of 0.92).   Medical History: Past Medical History  Diagnosis Date  . Arthritis   . Bipolar disorder     at one time, i was diagnosed with bipolar, but he takes no meds  . Hypertension     Medications:  Infusions:  . heparin 1,700 Units/hr (11/19/14 [id number])    Assessment: Patient with new PE in ED for SOB. MD wishes for pharmacy to dose heparin for PE.  No oral anticoagulants noted on med rec., hx of alcohol abuse, INR >1.5 on admit, will not give heparin bolus.  Today, 11/19/2014   Heparin Level 0.45 (therapeutic) on 1700 units/hr  INR = 1.65 following start of warfarin last evening. Note baseline INR elevated (? From EtOH abuse)  CBC: H/H WNL, pltc WNL  Goal of Therapy:  Heparin level 0.3-0.7  units/ml Monitor platelets by anticoagulation protocol: Yes   Plan:  Day #2 of minimum 5 days overlap for acute VTE  Continue Heparin drip at 1700 units/hr  Warfarin 5mg  PO X 1 tonight, dose more conservatively d/t elevated baseline INR.  If INR does not increase  In am then increase dose for 7/14  Coumadin book provided, video ordered  Note - [facility] asked pharmacy to educate patient on rivaroxaban and also see there is a consult to CM re: cost of oral anti-Xa's.  I discussed with patient warfarin vs oral anti-Xa's and the advantages and disadvantages of each.  From discussion, appears cost will play significant  role in this decision as he states he does not have medication coverage.   Educated on avoiding alcohol while on blood thinners  Daily CBC, heparin level, and INR  Pharmacist to provide additional education once know choice of anticoagulation patient to go home on.     Juliette Alcide, PharmD, BCPS.   Pager: [phone] 11/19/2014 1:18 PM

## 2014-11-19 NOTE — Progress Notes (Signed)
Pt's O2 saturation dropped to 87 at rest and while on room air.

## 2014-11-20 LAB — URINE CULTURE: Culture: >=100000

## 2014-11-20 LAB — CBC
HCT: 41.2 % (ref 39.0–52.0)
HEMOGLOBIN: 13.3 g/dL (ref 13.0–17.0)
MCH: 29.3 pg (ref 26.0–34.0)
MCHC: 32.3 g/dL (ref 30.0–36.0)
MCV: 90.7 fL (ref 78.0–100.0)
PLATELETS: 202 10*3/uL (ref 150–400)
RBC: 4.54 MIL/uL (ref 4.22–5.81)
RDW: 17 % — AB (ref 11.5–15.5)
WBC: 11.6 10*3/uL — ABNORMAL HIGH (ref 4.0–10.5)

## 2014-11-20 MED ORDER — RIVAROXABAN (XARELTO) VTE STARTER PACK (15 & 20 MG): ORAL_TABLET | ORAL | Status: DC

## 2014-11-20 MED ORDER — CEFPODOXIME PROXETIL 200 MG PO TABS: 200.0000 mg | ORAL_TABLET | Freq: Two times a day (BID) | ORAL | Status: DC

## 2014-11-20 MED ORDER — CEFUROXIME AXETIL 500 MG PO TABS: 500.0000 mg | ORAL_TABLET | Freq: Two times a day (BID) | ORAL | Status: DC

## 2014-11-20 NOTE — Care Management Note (Signed)
Case Management Note  Patient Details  Name: Mark Cantu MRN: [id number] Date of Birth: 05-04-1951    Expected Discharge Date:   (unknown)               Expected Discharge Plan:  Home w Home Health Services  In-House Referral:     Discharge planning Services  CM Consult, Medication Assistance, Indigent Health Clinic  Post Acute Care Choice:  Home Health Choice offered to:  Patient  DME Arranged:  Oxygen DME Agency:  [facility]  [facility]  Status of Service:  In process, will continue to follow  Medicare Important Message Given:    Date Medicare IM Given:    Medicare IM give by:    Date Additional Medicare IM Given:    Additional Medicare Important Message give by:     If discussed at Long Length of Stay Meetings, dates discussed:    Additional Comments: [facility] over the last two days to make sure pt will have appropriate follow up and financial help with Xeralto at [place]. None of the messages were returned so this CM has [facility] pt [facility] and placed on the AVS. Pt instructed to get his Xeralto starter pack from the [facility] outpt pharmacy with the 30 day free card and to [facility]. [facility]in would be free for him there. He has also been instructed that [facility] he needs to get help signing up for the PASS program. [facility] pt can receive financial assistance with getting his [place] RampXeralto even up to 6 months of medication with the PASS program. Pt also will be sent home on 02 and with a HHRN. [facility] DME and HH rep given referrals and will deliver 02 to room. Niece Sunny SchleinFelicia will pick pt up and transport him home this afternoon. All instructions were given to the pt, staff RN and Niece Sunny SchleinFelicia. No other needs noted.  Bartholome BillCLEMENTS, Hisako Bugh H, RN 11/20/2014, 12:36 PM

## 2014-11-20 NOTE — Discharge Summary (Addendum)
Physician Discharge Summary  Mark Rosseraul Altizer UJW:[id number] DOB: 09/04/1950 DOA: 11/17/2014  PCP: Burtis JunesBLOUNT,ALVIN VINCENT, MD  Admit date: 11/17/2014 Discharge date: 11/20/2014  Time spent: 60 minutes  Recommendations for Outpatient Follow-up:  1. hypercoagulable work up when off of Xarelto 2. Repeat ECHO in 2-3 months  3. Reassess need for home O2 and stop if able 4. F/u BP as outpt and start antihypertensive if needed 5. HHRN requested to ensure he is taking medication as ordered ADDENDUM- CASE MANAGEMENT HAS CALLED THE [facility] TWICE TODAY AND NO ONE HAS PICKED UP THE PHONE- SHE HAS SPOKEN WITH THE [facility] TODAY- THEY WILL BE ABLE TO SIGN HIM UP FOR THE PATH PROGRAM TO OBTAIN 6 MO FREE OF XARELTO AND CAN GIVE HIM CEFTIN FOR FREE AS WELL- HE WILL BE REFERRED TO THEM  Discharge Condition: stable Diet recommendation: heart healthy  Discharge Diagnoses:  Principal Problem:   Acute pulmonary embolism Active Problems:   UTI (lower urinary tract infection)   Alcohol abuse   History of present illness:  Mark Cantu is a 64 y.o. male with alcohol abuse and arthritis who presents with 5 weeks of shortness of breath and is found to have pulmonary emboli.  Hospital Course:  Principal Problem:  Pulmonary emboli/acute hypoxic respiratory failure -Bilateral PE -possible infarct at the right lung base and possible nodule or infarct at left lung base-no chest pain - needs 4 L O2 to keep pulse Ox at 90% on ambulation- home O2 ordered -Has a dilated RV (likley from PE) and mildly elevated troponin but no hypotension/hemodynamic instability, no chest pain and therefore, at this time no need for thrombolytics transitioned from heparin to Xarelto-given a 30 day free card and Xarelto rep is helping to decide if he will qualify for future Xarelto as he has no insurance to pay for medications- [facility] and was told they will assist him with obtaining  Xarelto - SEE ADDENDUM ABOVE-  -Lower extremity vascular Doppler negative for DVT -Will need outpatient hypercoagulable workup once off of anticoagulation - proair was recently started at PCPs office recently for dyspnea- as we have determined the cause, he no longer needs this  Active Problems:  UTI (lower urinary tract infection) -Greater than 100,000 colonies of gram-negative rods-- will transition Rocephin to Ceftin to complete a 7 day course - I will follow up on final culture results Addendum- U culture: Klebsiella sensitive to Rocephin- cont Ceftin  Dilated RV- mod TR - suspect due to PE as dyspnea started about 5 wks ago - No Mention of pulmonary hypertension on 2-D echo  Severe LVH -BP was mostly normal yesterday- has had a high reading today but this is isolated- recommend to follow ambulatory BPs as outpt  HTN? - has a bottle of HCTZ which is empty- he was taking it in the past but never had it refilled- asked to hold off it for now   Alcohol abuse -Drinks 3 40 ounce beers a day-states that he will stop this now-no signs of withdrawal  Discussed plan with niece in detail- she knows not to allow him to drink, make sure he is taking meds appropriately and go with him to his doctors visits.   Procedures:  Venous duplex   ECHO  Left ventricle: possible small mid cavitary gradient as LV is underfilled. The cavity size was normal. Wall thickness was increased in a pattern of severe LVH. Systolic function was normal. The estimated ejection fraction was  in the range of 60% to 65%. - Right ventricle: The cavity size was severely dilated. - Right atrium: The atrium was moderately dilated. - Tricuspid valve: There was moderate regurgitation.  Consultations:  none  Discharge Exam: Filed Weights   11/18/14 0216 11/18/14 0700 11/19/14 0600  Weight: 86.183 kg (190 lb) 87 kg (191 lb 12.8 oz) 87.3 kg (192 lb 7.4 oz)   Filed Vitals:   11/20/14 1012  BP: 154/101   Pulse: 80  Temp:   Resp: 26    General: AAO x 3, no distress Cardiovascular: RRR, no murmurs  Respiratory: clear to auscultation bilaterally GI: soft, non-tender, non-distended, bowel sound positive  Discharge Instructions You were cared for by a hospitalist during your hospital stay. If you have any questions about your discharge medications or the care you received while you were in the hospital after you are discharged, you can call the unit and asked to speak with the hospitalist on call if the hospitalist that took care of you is not available. Once you are discharged, your primary care physician will handle any further medical issues. Please note that NO REFILLS for any discharge medications will be authorized once you are discharged, as it is imperative that you return to your primary care physician (or establish a relationship with a primary care physician if you do not have one) for your aftercare needs so that they can reassess your need for medications and monitor your lab values.      Discharge Instructions    Diet - low sodium heart healthy    Complete by:  As directed      Discharge instructions    Complete by:  As directed   You must go to [facility] to ensure you will be able to get the Xarelto-     Increase activity slowly    Complete by:  As directed             Medication List    STOP taking these medications        naproxen 500 MG tablet  Commonly known as:  NAPROSYN     oxyCODONE-acetaminophen 7.5-325 MG per tablet  Commonly known as:  PERCOCET     PROAIR RESPICLICK 108 (90 BASE) MCG/ACT Aepb  Generic drug:  Albuterol Sulfate      TAKE these medications        cefUROXime 500 MG tablet  Commonly known as:  CEFTIN  Take 1 tablet (500 mg total) by mouth 2 (two) times daily with a meal.     Rivaroxaban 15 & 20 MG Tbpk  Commonly known as:  XARELTO STARTER PACK  Take as directed on package: Start with one 15mg  tablet by mouth twice a day  with food. On Day 22, switch to one 20mg  tablet once a day with food.       No Known Allergies    The results of significant diagnostics from this hospitalization (including imaging, microbiology, ancillary and laboratory) are listed below for reference.    Significant Diagnostic Studies: Dg Chest 2 View  11/17/2014   CLINICAL DATA:  64 year old male with shortness breath for the past 5 weeks progressively getting worse.  EXAM: CHEST  2 VIEW  COMPARISON:  No priors.  FINDINGS: Lung volumes are normal. No consolidative airspace disease. No pleural effusions. No pneumothorax. No pulmonary nodule or mass noted. Pulmonary vasculature and the cardiomediastinal silhouette are within normal limits. Atherosclerotic calcifications in the thoracic aorta.  IMPRESSION: 1. No radiographic evidence  of acute cardiopulmonary disease. 2. Atherosclerosis.   Electronically Signed   By: Trudie Reed M.D.   On: 11/17/2014 21:36   Ct Angio Chest Pe W/cm &/or Wo Cm  11/18/2014   CLINICAL DATA:  Shortness of breath for 5 weeks, worsening. Elevated D-dimer.  EXAM: CT ANGIOGRAPHY CHEST WITH CONTRAST  TECHNIQUE: Multidetector CT imaging of the chest was performed using the standard protocol during bolus administration of intravenous contrast. Multiplanar CT image reconstructions and MIPs were obtained to evaluate the vascular anatomy.  CONTRAST:  OMNIPAQUE IOHEXOL 350 MG/ML SOLN  COMPARISON:  Chest 11/17/2014  FINDINGS: Technically adequate study with good opacification of the central and segmental pulmonary arteries. Filling defects are demonstrated in the distal right main pulmonary artery, extending into right upper lung, right middle lung, and right lower lung branches. Segmental and subsegmental emboli are demonstrated in the left lower lung. RV to LV ratio is 1.86, suggesting right heart strain. There is also reflux of contrast material noted into the hepatic veins.  Mild cardiac enlargement. Small pericardial  effusion. Normal caliber thoracic aorta. Scattered lymph nodes in the mediastinum are not pathologically enlarged and are likely reactive. Esophagus is decompressed.  Infiltration in the right lung base may indicate pulmonary infarct. Focal nodule in the left lung base laterally measuring 14 mm. This could represent infarct or pulmonary nodule and followup after resolution of acute process is suggested to exclude significant pulmonary nodule. Patchy emphysematous changes in the lungs. No pneumothorax. No pleural effusions. Airways appear patent.  Included portions of the upper abdominal organs are grossly unremarkable. No destructive bone lesions.  Review of the MIP images confirms the above findings.  IMPRESSION: Examination is positive for bilateral pulmonary emboli. RV to LV ratio is 1.86 suggesting right heart strain. Infiltration in the right lung base could indicate pneumonia or pulmonary infarct. Nonspecific 14 mm nodule in the left lung base could indicate small infarct but follow-up is suggested to exclude significant pulmonary nodule.  These results were called by telephone at the time of interpretation on 11/18/2014 at 1:29 am to PA [facility] , who verbally acknowledged these results.   Electronically Signed   By: Burman Nieves M.D.   On: 11/18/2014 01:31    Microbiology: Recent Results (from the past 240 hour(s))  Urine culture     Status: None   Collection Time: 11/18/14 12:53 AM  Result Value Ref Range Status   Specimen Description URINE, RANDOM  Final   Special Requests NONE  Final   Culture   Final    >=100,000 COLONIES/mL KLEBSIELLA OXYTOCA Performed at [facility]    Report Status 11/20/2014 FINAL  Final   Organism ID, Bacteria KLEBSIELLA OXYTOCA  Final      Susceptibility   Klebsiella oxytoca - MIC*    AMPICILLIN >=32 RESISTANT Resistant     CEFAZOLIN 8 SENSITIVE Sensitive     CEFTRIAXONE <=1 SENSITIVE Sensitive     CIPROFLOXACIN <=0.25 SENSITIVE Sensitive      GENTAMICIN <=1 SENSITIVE Sensitive     IMIPENEM <=0.25 SENSITIVE Sensitive     NITROFURANTOIN <=16 SENSITIVE Sensitive     TRIMETH/SULFA <=20 SENSITIVE Sensitive     AMPICILLIN/SULBACTAM 8 SENSITIVE Sensitive     PIP/TAZO <=4 SENSITIVE Sensitive     * >=100,000 COLONIES/mL KLEBSIELLA OXYTOCA  MRSA PCR Screening     Status: None   Collection Time: 11/18/14  6:56 AM  Result Value Ref Range Status   MRSA by PCR NEGATIVE NEGATIVE Final  Comment:        The GeneXpert MRSA Assay (FDA approved for NASAL specimens only), is one component of a comprehensive MRSA colonization surveillance program. It is not intended to diagnose MRSA infection nor to guide or monitor treatment for MRSA infections.      Labs: Basic Metabolic Panel:  Recent Labs Lab 11/17/14 2252 11/18/14 0550 11/19/14 0330  NA 133* 133* 137  K 4.7 4.2 4.1  CL 103 101 106  CO2 21* 21* 24  GLUCOSE 100* 92 94  BUN CREATININE 1.15 1.04 0.92  CALCIUM 8.7* 8.2* 8.5*   Liver Function Tests:  Recent Labs Lab 11/18/14 0550  AST 62*  ALT 42  ALKPHOS 77  BILITOT 1.9*  PROT 5.9*  ALBUMIN 3.3*   No results for input(s): LIPASE, AMYLASE in the last 168 hours. No results for input(s): AMMONIA in the last 168 hours. CBC:  Recent Labs Lab 11/17/14 2252 11/18/14 0308 11/18/14 0550 11/19/14 0330 11/20/14 0434  WBC 20.9* 19.6* 18.2* 11.8* 11.6*  NEUTROABS 17.0*  --  14.1*  --   --   HGB 14.2 14.8 13.5 13.0 13.3  HCT 42.7 44.0 39.1 39.8 41.2  MCV 88.6 88.2 87.5 89.6 90.7  PLT 247 215 217 208 202   Cardiac Enzymes:  Recent Labs Lab 11/18/14 0550 11/18/14 1135 11/18/14 1919  TROPONINI 0.08* 0.05* 0.06*   BNP: BNP (last 3 results)  Recent Labs  11/17/14 2252  BNP 1256.4*    ProBNP (last 3 results) No results for input(s): PROBNP in the last 8760 hours.  CBG: No results for input(s): GLUCAP in the last 168 hours.     SignedCalvert Cantor, MD [facility] 11/20/2014,  11:55 AM

## 2014-11-20 NOTE — Progress Notes (Signed)
Discussed with patient discharge instructions, including follow up appointment and where to pick up medications.  Patient verbalized agreement and understanding.  Patient's IV's were discontinued with no complications.  Patient to go down in wheelchair with all belongings to go home in private vehicle.

## 2014-11-20 NOTE — Hospital Discharge Follow-Up (Signed)
Received call from Arna MediciNora, RN CM who indicated patient need hospital follow-up appointment. Patient currently hospitalized for acute pulmonary embolism. Patient to discharge on Xarelto. Appointment obtained on 11/28/14 at 1030 with Dr. Orpah CobbAdvani. Appointment placed on AVS. Arna Mediciora indicated she was going to instruct patient to [facility] to get it filled.

## 2014-11-20 NOTE — Progress Notes (Signed)
SATURATION QUALIFICATIONS: (This note is used to comply with regulatory documentation for home oxygen)  Patient Saturations on Room Air at Rest = 86%  Patient Saturations on Room Air while Ambulating = 88%  Patient Saturations on 4 Liters of oxygen while Ambulating = 90%  Please briefly explain why patient needs home oxygen: To be able to maintain sats above 90% while ambulating.

## 2014-11-20 NOTE — Discharge Instructions (Signed)
Information on my medicine - XARELTO (rivaroxaban)  This medication education was reviewed with me or my healthcare representative as part of my discharge preparation.  The pharmacist that spoke with me during my hospital stay was:  Dannielle HuhZeigler, Hildy Nicholl George, [facility]PH  WHY WAS Carlena HurlXARELTO PRESCRIBED FOR YOU? Xarelto was prescribed to treat blood clots that may have been found in the veins of your legs (deep vein thrombosis) or in your lungs (pulmonary embolism) and to reduce the risk of them occurring again.  What do you need to know about Xarelto? The starting dose is one 15 mg tablet taken TWICE daily with food for the FIRST 21 DAYS then on (enter date)  Thursday, Aug 4th 2016 the dose is changed to one 20 mg tablet taken ONCE A DAY with your evening meal.  DO NOT stop taking Xarelto without talking to the health care provider who prescribed the medication.  Refill your prescription for 20 mg tablets before you run out.  After discharge, you should have regular check-up appointments with your healthcare provider that is prescribing your Xarelto.  In the future your dose may need to be changed if your kidney function changes by a significant amount.  What do you do if you miss a dose? If you are taking Xarelto TWICE DAILY and you miss a dose, take it as soon as you remember. You may take two 15 mg tablets (total 30 mg) at the same time then resume your regularly scheduled 15 mg twice daily the next day.  If you are taking Xarelto ONCE DAILY and you miss a dose, take it as soon as you remember on the same day then continue your regularly scheduled once daily regimen the next day. Do not take two doses of Xarelto at the same time.   Important Safety Information Xarelto is a blood thinner medicine that can cause bleeding. You should call your healthcare provider right away if you experience any of the following: ? Bleeding from an injury or your nose that does not stop. ? Unusual colored urine (red  or dark brown) or unusual colored stools (red or black). ? Unusual bruising for unknown reasons. ? A serious fall or if you hit your head (even if there is no bleeding).  Some medicines may interact with Xarelto and might increase your risk of bleeding while on Xarelto. To help avoid this, consult your healthcare provider or pharmacist prior to using any new prescription or non-prescription medications, including herbals, vitamins, non-steroidal anti-inflammatory drugs (NSAIDs) and supplements.  This website has more information on Xarelto: [email or web address].

## 2014-11-20 NOTE — Progress Notes (Signed)
Patient's O2 sast on room air at rest were 88%.  Patient began to ambulate and O2 sats dropped to 86%.  Patient placed on 2L O2 and sats came up to 88%, O2 increased to 4L and sats came up to 90%.

## 2014-11-24 ENCOUNTER — Ambulatory Visit: Payer: Medicare Other | Attending: Internal Medicine

## 2014-11-28 ENCOUNTER — Ambulatory Visit: Payer: Medicare Other | Attending: Internal Medicine | Admitting: Internal Medicine

## 2014-11-28 ENCOUNTER — Encounter: Payer: Self-pay | Admitting: Internal Medicine

## 2014-11-28 VITALS — BP 155/92 | HR 93 | Temp 98.0°F | Resp 15 | Wt 198.6 lb

## 2014-11-28 DIAGNOSIS — I2699 Other pulmonary embolism without acute cor pulmonale: Secondary | ICD-10-CM | POA: Diagnosis not present

## 2014-11-28 DIAGNOSIS — I10 Essential (primary) hypertension: Secondary | ICD-10-CM | POA: Diagnosis not present

## 2014-11-28 DIAGNOSIS — Z87440 Personal history of urinary (tract) infections: Secondary | ICD-10-CM | POA: Insufficient documentation

## 2014-11-28 DIAGNOSIS — Z720 Tobacco use: Secondary | ICD-10-CM | POA: Diagnosis not present

## 2014-11-28 DIAGNOSIS — F1010 Alcohol abuse, uncomplicated: Secondary | ICD-10-CM | POA: Diagnosis not present

## 2014-11-28 LAB — COMPLETE METABOLIC PANEL WITH GFR
ALT: 12 U/L (ref 0–53)
AST: 16 U/L (ref 0–37)
Albumin: 3.6 g/dL (ref 3.5–5.2)
Alkaline Phosphatase: 52 U/L (ref 39–117)
BUN: 7 mg/dL (ref 6–23)
CALCIUM: 8.6 mg/dL (ref 8.4–10.5)
CHLORIDE: 102 meq/L (ref 96–112)
CO2: 30 meq/L (ref 19–32)
CREATININE: 0.93 mg/dL (ref 0.50–1.35)
GFR, EST NON AFRICAN AMERICAN: 86 mL/min
GLUCOSE: 101 mg/dL — AB (ref 70–99)
Potassium: 3.9 mEq/L (ref 3.5–5.3)
Sodium: 142 mEq/L (ref 135–145)
TOTAL PROTEIN: 6 g/dL (ref 6.0–8.3)
Total Bilirubin: 0.7 mg/dL (ref 0.2–1.2)

## 2014-11-28 MED ORDER — HYDROCHLOROTHIAZIDE 12.5 MG PO TABS: 12.5000 mg | ORAL_TABLET | Freq: Every day | ORAL | Status: DC

## 2014-11-28 NOTE — Progress Notes (Signed)
Patient here for follow up on his pulmonary embolism and general check up

## 2014-11-28 NOTE — Progress Notes (Signed)
Patient Demographics  Mark Cantu, is a 64 y.o. male  RUE:[id number]  JYN:[id number]  DOB - March 03, 1951  CC:  Chief Complaint  Patient presents with  . Follow-up       HPI: Mark Cantu is a 64 y.o. male here today to establish medical care. Patient has history of Alcohol abuse, arthritis, recently hospitalized with symptoms of shortness of breath, EMR reviewed patient was diagnosed with bilateral PE, also requiring oxygen because he was hypoxemic, echo showed right ventricular dilation likely secondary to PE, patient was started on Xarelto discharged, lower extremely ultrasound was negative for DVT, he was also treated for UTI and was discharged on Ceftin, as per patient he has finished the course of antibiotic, he is taking Xarelto for DVT, he's not taking any blood pressure medication ? He was on hydrochlorothiazide in the past. Patient has No headache, No chest pain, No abdominal pain - No Nausea, No new weakness tingling or numbness, No Cough - SOB.  No Known Allergies Past Medical History  Diagnosis Date  . Arthritis   . Bipolar disorder     at one time, i was diagnosed with bipolar, but he takes no meds  . Hypertension    Current Outpatient Prescriptions on File Prior to Visit  Medication Sig Dispense Refill  . cefUROXime (CEFTIN) 500 MG tablet Take 1 tablet (500 mg total) by mouth 2 (two) times daily with a meal. 10 tablet 0  . Rivaroxaban (XARELTO STARTER PACK) 15 & 20 MG TBPK Take as directed on package: Start with one  tablet by mouth twice a day with food. On Day 22, switch to one  tablet once a day with food. 51 each 0   No current facility-administered medications on file prior to visit.   Family History  Problem Relation Age of Onset  . Diabetes Mother   . Arthritis Mother   . Hypertension Father   . Hypertension Sister   . Diabetes Brother    History   Social History  . Marital Status: Single    Spouse Name: N/A  . Number of Children: N/A    . Years of Education: N/A   Occupational History  . Not on file.   Social History Main Topics  . Smoking status: Current Some Day Smoker -- 0.00 packs/day for 45 years    Types: Cigarettes  . Smokeless tobacco: Not on file  . Alcohol Use: 8.4 oz/week    14 Cans of beer per week  . Drug Use: No     Comment: 4-5 YRS AGO  . Sexual Activity: Not on file   Other Topics Concern  . Not on file   Social History Narrative    Review of Systems: Constitutional: Negative for fever, chills, diaphoresis, activity change, appetite change and fatigue. HENT: Negative for ear pain, nosebleeds, congestion, facial swelling, rhinorrhea, neck pain, neck stiffness and ear discharge.  Eyes: Negative for pain, discharge, redness, itching and visual disturbance. Respiratory: Negative for cough, choking, chest tightness, shortness of breath, wheezing and stridor.  Cardiovascular: Negative for chest pain, palpitations and leg swelling. Gastrointestinal: Negative for abdominal distention. Genitourinary: Negative for dysuria, urgency, frequency, hematuria, flank pain, decreased urine volume, difficulty urinating and dyspareunia.  Musculoskeletal: Negative for back pain, joint swelling, arthralgia and gait problem. Neurological: Negative for dizziness, tremors, seizures, syncope, facial asymmetry, speech difficulty, weakness, light-headedness, numbness and headaches.  Hematological: Negative for adenopathy. Does not bruise/bleed easily. Psychiatric/Behavioral: Negative for hallucinations, behavioral problems, confusion, dysphoric mood, decreased concentration  and agitation.    Objective:   Filed Vitals:   11/28/14 1005  BP: 155/92  Pulse: 93  Temp: 98 F (36.7 C)  Resp: 15    Physical Exam: Constitutional: Patient is on continuous oxygen not in acute respiratory distress. HENT: Normocephalic, atraumatic, External right and left ear normal. Oropharynx is clear and moist.  Eyes: Conjunctivae and  EOM are normal. PERRLA, no scleral icterus. Neck: Normal ROM. Neck supple. No JVD. No tracheal deviation. No thyromegaly. CVS: RRR, S1/S2 +, no murmurs, no gallops, no carotid bruit.  Pulmonary: Effort and breath sounds normal, no stridor, rhonchi, wheezes, rales.  Abdominal: Soft. BS +, no distension, tenderness, rebound or guarding.  Musculoskeletal: Normal range of motion. No edema and no tenderness.  Neuro: Alert. Normal reflexes, muscle tone coordination. No cranial nerve deficit. Skin: Skin is warm and dry. No rash noted. Not diaphoretic. No erythema. No pallor. Psychiatric: Normal mood and affect. Behavior, judgment, thought content normal.  Lab Results  Component Value Date   WBC 11.6* 11/20/2014   HGB 13.3 11/20/2014   HCT 41.2 11/20/2014   MCV 90.7 11/20/2014   PLT 202 11/20/2014   Lab Results  Component Value Date   CREATININE 0.92 11/19/2014   BUN 18 11/19/2014   NA 137 11/19/2014   K 4.1 11/19/2014   CL 106 11/19/2014   CO2 24 11/19/2014    No results found for: HGBA1C Lipid Panel  No results found for: CHOL, TRIG, HDL, CHOLHDL, VLDL, LDLCALC     Assessment and plan:   1. Acute pulmonary embolism Continue with Xarelto.  2. Alcohol abuse Patient is going to try to cut down on drinking alcohol.  3. Essential hypertension Advised patient for DASH diet, started on hydrochlorothiazide, patient will come back in 2 weeks for nurse visit BP check. - COMPLETE METABOLIC PANEL WITH GFR - hydrochlorothiazide (HYDRODIURIL) 12.5 MG tablet; Take 1 tablet (12.5 mg total) by mouth daily.  Dispense: 30 tablet; Refill: 3  4. History of UTI Will repeat UA. - Urinalysis, Routine w reflex microscopic (not at [facility])    Return in about 3 months (around 02/28/2015) for hypertension, BP check in 2 weeks/Nurse Visit.    The patient was given clear instructions to go to ER or return to medical center if symptoms don't improve, worsen or new problems develop. The patient  verbalized understanding. The patient was told to call to get lab results if they haven't heard anything in the next week.    This note has been created with Education officer, environmental. Any transcriptional errors are unintentional.   Doris Cheadle, MD

## 2014-11-29 LAB — URINALYSIS, ROUTINE W REFLEX MICROSCOPIC
Bilirubin Urine: NEGATIVE
Glucose, UA: NEGATIVE mg/dL
Hgb urine dipstick: NEGATIVE
Ketones, ur: NEGATIVE mg/dL
Leukocytes, UA: NEGATIVE
NITRITE: NEGATIVE
Protein, ur: NEGATIVE mg/dL
Specific Gravity, Urine: 1.018 (ref 1.005–1.030)
UROBILINOGEN UA: 1 mg/dL (ref 0.0–1.0)
pH: 6 (ref 5.0–8.0)

## 2014-12-01 ENCOUNTER — Telehealth: Payer: Self-pay

## 2014-12-01 NOTE — Telephone Encounter (Signed)
Patient is aware of his lab results 

## 2014-12-01 NOTE — Telephone Encounter (Signed)
-----   Message from Doris Cheadle, MD sent at 12/01/2014 12:02 PM EDT ----- Call and let the patient know that his urine test is normal. Patient has borderline elevated blood sugar level, advise patient for low carbohydrate diet.

## 2014-12-11 ENCOUNTER — Ambulatory Visit: Payer: Medicare Other | Attending: Internal Medicine | Admitting: Pharmacist

## 2014-12-11 DIAGNOSIS — I10 Essential (primary) hypertension: Secondary | ICD-10-CM

## 2014-12-11 NOTE — Progress Notes (Signed)
I agree with the pharmacy resident's note as documented. I was present for the patient visit.  Juanita Craver, PharmD, BCPS Clinical Pharmacist

## 2014-12-11 NOTE — Progress Notes (Signed)
S:    Patient arrives in good spirits with supplemental oxygen.    He presents to the clinic for hypertension evaluation.    Patient denies adherence with medications. Pt states is currently taking his xarelto as prescribed but has not picked up his HCTZ from the pharmacy.   Current BP Medications include:  HCTZ 12.5 mg daily (pt not taking currently)  Antihypertensives tried in the past include: N/A   O:   Last 3 Office BP readings: BP Readings from Last 3 Encounters:  11/28/14 155/92  11/20/14 143/109  03/26/14 176/99    BMET    Component Value Date/Time   NA 142 11/28/2014 1040   K 3.9 11/28/2014 1040   CL 102 11/28/2014 1040   CO2 30 11/28/2014 1040   GLUCOSE 101* 11/28/2014 1040   BUN 7 11/28/2014 1040   CREATININE 0.93 11/28/2014 1040   CREATININE 0.92 11/19/2014 0330   CALCIUM 8.6 11/28/2014 1040   GFRNONAA 86 11/28/2014 1040   GFRNONAA >60 11/19/2014 0330   GFRAA >89 11/28/2014 1040   GFRAA >60 11/19/2014 0330    A/P:  History of hypertension which currently is uncontrolled.  Pt is prescribed HCTZ 12.5 mg daily has not yet taken medications based upon access to medication issues.  Pt states niece will pick up medication today. Pt will call clinic if has problems obtaining medication. Will not add additional pharmacotherapy at this time for HTN.  Discussed Dash diet and limiting dietary sodium with patient and provided written information.     Results reviewed and written information provided.   F/U Clinic Visit with Dr. Royal Hawthorn, PharmD on 8/18.  Total time in face-to-face counseling 30 minutes.  Patient seen with Hazle Nordmann, PharmD Pharmacy Resident.

## 2014-12-11 NOTE — Patient Instructions (Signed)
It was great to see you today!  Please have your niece pick up your hydrochlorothiazide today and start it!  DASH Eating Plan DASH stands for "Dietary Approaches to Stop Hypertension." The DASH eating plan is a healthy eating plan that has been shown to reduce high blood pressure (hypertension). Additional health benefits may include reducing the risk of type 2 diabetes mellitus, heart disease, and stroke. The DASH eating plan may also help with weight loss. WHAT DO I NEED TO KNOW ABOUT THE DASH EATING PLAN? For the DASH eating plan, you will follow these general guidelines:  Choose foods with a percent daily value for sodium of less than 5% (as listed on the food label).  Use salt-free seasonings or herbs instead of table salt or sea salt.  Check with your health care provider or pharmacist before using salt substitutes.  Eat lower-sodium products, often labeled as "lower sodium" or "no salt added."  Eat fresh foods.  Eat more vegetables, fruits, and low-fat dairy products.  Choose whole grains. Look for the word "whole" as the first word in the ingredient list.  Choose fish and skinless chicken or [place] more often than red meat. Limit fish, poultry, and meat to 6 oz (170 g) each day.  Limit sweets, desserts, sugars, and sugary drinks.  Choose heart-healthy fats.  Limit cheese to 1 oz (28 g) per day.  Eat more home-cooked food and less restaurant, buffet, and fast food.  Limit fried foods.  Cook foods using methods other than frying.  Limit canned vegetables. If you do use them, rinse them well to decrease the sodium.  When eating at a restaurant, ask that your food be prepared with less salt, or no salt if possible. WHAT FOODS CAN I EAT? Seek help from a dietitian for individual calorie needs. Grains Whole grain or whole wheat bread. Brown rice. Whole grain or whole wheat pasta. Quinoa, bulgur, and whole grain cereals. Low-sodium cereals. Corn or whole wheat flour  tortillas. Whole grain cornbread. Whole grain crackers. Low-sodium crackers. Vegetables Fresh or frozen vegetables (raw, steamed, roasted, or grilled). Low-sodium or reduced-sodium tomato and vegetable juices. Low-sodium or reduced-sodium tomato sauce and paste. Low-sodium or reduced-sodium canned vegetables.  Fruits All fresh, canned (in natural juice), or frozen fruits. Meat and Other Protein Products Ground beef (85% or leaner), grass-fed beef, or beef trimmed of fat. Skinless chicken or [place]. Ground chicken or [place]. Pork trimmed of fat. All fish and seafood. Eggs. Dried beans, peas, or lentils. Unsalted nuts and seeds. Unsalted canned beans. Dairy Low-fat dairy products, such as skim or 1% milk, 2% or reduced-fat cheeses, low-fat ricotta or cottage cheese, or plain low-fat yogurt. Low-sodium or reduced-sodium cheeses. Fats and Oils Tub margarines without trans fats. Light or reduced-fat mayonnaise and salad dressings (reduced sodium). Avocado. Safflower, olive, or canola oils. Natural peanut or almond butter. Other Unsalted popcorn and pretzels. The items listed above may not be a complete list of recommended foods or beverages. Contact your dietitian for more options. WHAT FOODS ARE NOT RECOMMENDED? Grains White bread. White pasta. White rice. Refined cornbread. Bagels and croissants. Crackers that contain trans fat. Vegetables Creamed or fried vegetables. Vegetables in a cheese sauce. Regular canned vegetables. Regular canned tomato sauce and paste. Regular tomato and vegetable juices. Fruits Dried fruits. Canned fruit in light or heavy syrup. Fruit juice. Meat and Other Protein Products Fatty cuts of meat. Ribs, chicken wings, bacon, sausage, bologna, salami, chitterlings, fatback, hot dogs, bratwurst, and packaged luncheon meats. Salted nuts  and seeds. Canned beans with salt. Dairy Whole or 2% milk, cream, half-and-half, and cream cheese. Whole-fat or sweetened yogurt. Full-fat  cheeses or blue cheese. Nondairy creamers and whipped toppings. Processed cheese, cheese spreads, or cheese curds. Condiments Onion and garlic salt, seasoned salt, table salt, and sea salt. Canned and packaged gravies. Worcestershire sauce. Tartar sauce. Barbecue sauce. Teriyaki sauce. Soy sauce, including reduced sodium. Steak sauce. Fish sauce. Oyster sauce. Cocktail sauce. Horseradish. Ketchup and mustard. Meat flavorings and tenderizers. Bouillon cubes. Hot sauce. Tabasco sauce. Marinades. Taco seasonings. Relishes. Fats and Oils Butter, stick margarine, lard, shortening, ghee, and bacon fat. Coconut, palm kernel, or palm oils. Regular salad dressings. Other Pickles and olives. Salted popcorn and pretzels. The items listed above may not be a complete list of foods and beverages to avoid. Contact your dietitian for more information. WHERE CAN I FIND MORE INFORMATION? National Heart, Lung, and Blood Institute: [employer].it Document Released: 04/14/2011 Document Revised: 09/09/2013 Document Reviewed: 02/27/2013 [facility] Patient Information 2015 [place], [place]. This information is not intended to replace advice given to you by your health care provider. Make sure you discuss any questions you have with your health care provider.  Hypertension Hypertension, commonly called high blood pressure, is when the force of blood pumping through your arteries is too strong. Your arteries are the blood vessels that carry blood from your heart throughout your body. A blood pressure reading consists of a higher number over a lower number, such as 110/72. The higher number (systolic) is the pressure inside your arteries when your heart pumps. The lower number (diastolic) is the pressure inside your arteries when your heart relaxes. Ideally you want your blood pressure below 120/80. Hypertension forces your heart to work harder to pump blood. Your arteries may become narrow or  stiff. Having hypertension puts you at risk for heart disease, stroke, and other problems.  RISK FACTORS Some risk factors for high blood pressure are controllable. Others are not.  Risk factors you cannot control include:   Race. You may be at higher risk if you are African American.  Age. Risk increases with age.  Gender. Men are at higher risk than women before age 98 years. After age 37, women are at higher risk than men. Risk factors you can control include:  Not getting enough exercise or physical activity.  Being overweight.  Getting too much fat, sugar, calories, or salt in your diet.  Drinking too much alcohol. SIGNS AND SYMPTOMS Hypertension does not usually cause signs or symptoms. Extremely high blood pressure (hypertensive crisis) may cause headache, anxiety, shortness of breath, and nosebleed. DIAGNOSIS  To check if you have hypertension, your health care provider will measure your blood pressure while you are seated, with your arm held at the level of your heart. It should be measured at least twice using the same arm. Certain conditions can cause a difference in blood pressure between your right and left arms. A blood pressure reading that is higher than normal on one occasion does not mean that you need treatment. If one blood pressure reading is high, ask your health care provider about having it checked again. TREATMENT  Treating high blood pressure includes making lifestyle changes and possibly taking medicine. Living a healthy lifestyle can help lower high blood pressure. You may need to change some of your habits. Lifestyle changes may include:  Following the DASH diet. This diet is high in fruits, vegetables, and whole grains. It is low in salt, red meat, and added sugars.  Getting at least 2 hours of brisk physical activity every week.  Losing weight if necessary.  Not smoking.  Limiting alcoholic beverages.  Learning ways to reduce stress. If lifestyle  changes are not enough to get your blood pressure under control, your health care provider may prescribe medicine. You may need to take more than one. Work closely with your health care provider to understand the risks and benefits. HOME CARE INSTRUCTIONS  Have your blood pressure rechecked as directed by your health care provider.   Take medicines only as directed by your health care provider. Follow the directions carefully. Blood pressure medicines must be taken as prescribed. The medicine does not work as well when you skip doses. Skipping doses also puts you at risk for problems.   Do not smoke.   Monitor your blood pressure at home as directed by your health care provider. SEEK MEDICAL CARE IF:   You think you are having a reaction to medicines taken.  You have recurrent headaches or feel dizzy.  You have swelling in your ankles.  You have trouble with your vision. SEEK IMMEDIATE MEDICAL CARE IF:  You develop a severe headache or confusion.  You have unusual weakness, numbness, or feel faint.  You have severe chest or abdominal pain.  You vomit repeatedly.  You have trouble breathing. MAKE SURE YOU:   Understand these instructions.  Will watch your condition.  Will get help right away if you are not doing well or get worse. Document Released: 04/25/2005 Document Revised: 09/09/2013 Document Reviewed: 02/15/2013 [facility] Patient Information 2015 [place], [place]. This information is not intended to replace advice given to you by your health care provider. Make sure you discuss any questions you have with your health care provider.

## 2014-12-22 ENCOUNTER — Telehealth: Payer: Self-pay | Admitting: Internal Medicine

## 2014-12-22 NOTE — Telephone Encounter (Signed)
Patient called requesting a medication refill for, Rivaroxaban (XARELTO STARTER PACK), Please follow up.

## 2014-12-25 ENCOUNTER — Ambulatory Visit: Payer: Medicare Other | Attending: Internal Medicine | Admitting: Pharmacist

## 2014-12-25 VITALS — BP 142/94 | HR 62

## 2014-12-25 DIAGNOSIS — Z720 Tobacco use: Secondary | ICD-10-CM | POA: Insufficient documentation

## 2014-12-25 DIAGNOSIS — I10 Essential (primary) hypertension: Secondary | ICD-10-CM | POA: Insufficient documentation

## 2014-12-25 MED ORDER — RIVAROXABAN 20 MG PO TABS: 20.0000 mg | ORAL_TABLET | Freq: Every day | ORAL | Status: DC

## 2014-12-25 MED ORDER — HYDROCHLOROTHIAZIDE 12.5 MG PO TABS: 25.0000 mg | ORAL_TABLET | Freq: Every day | ORAL | Status: DC

## 2014-12-25 NOTE — Progress Notes (Signed)
S:    Patient arrives in good spirits.  He presents to the clinic for blood pressure evaluation.   Patient reports adherence with medications but ran out of Xarelto a few days ago. Patient reports that he typically takes his medications around 6-7 pm every night. He reports that he started taking the hydrochlorothiazide about a week ago.   Current BP Medications include:  Hydrochlorothiazide 12.5 mg daily   Patient denies chest pain or leg swelling. Patient denies any symptoms concerning for stroke.   O:   Last 3 Office BP readings: BP Readings from Last 3 Encounters:  12/25/14 142/94  11/28/14 155/92  11/20/14 143/109    BMET    Component Value Date/Time   NA 142 11/28/2014 1040   K 3.9 11/28/2014 1040   CL 102 11/28/2014 1040   CO2 30 11/28/2014 1040   GLUCOSE 101* 11/28/2014 1040   BUN 7 11/28/2014 1040   CREATININE 0.93 11/28/2014 1040   CREATININE 0.92 11/19/2014 0330   CALCIUM 8.6 11/28/2014 1040   GFRNONAA 86 11/28/2014 1040   GFRNONAA >60 11/19/2014 0330   GFRAA >89 11/28/2014 1040   GFRAA >60 11/19/2014 0330    A/P:  Hypertension: currently UNcontrolled on current medication of hydrochlorothiazide 12.5 mg daily. Will increase to hydrochlorothiazide 25 mg daily. No symptoms of hypokalemia so will defer BMET at this time as patient has only been taking the hydrochlorothiazide for 1 week. Provided education on DASH diet.   Pulmonary Embolism: patient out of Xarelto x 3 days. No signs or symptoms of new DVT, PE, or stroke. Will reorder Xarelto 20 mg daily for continuation of treatment for pulmonary embolism. Stressed adherence to Xarelto and the increased risk of stroke if he misses any doses.     Results reviewed and written information provided.   F/U Clinic Visit with Pharmacy in two weeks. Total time in face-to-face counseling 20 minutes.  Patient seen with Hazle Nordmann, PharmD Resident.    Juanita Craver, PharmD, BCPS, CPP

## 2014-12-25 NOTE — Patient Instructions (Addendum)
Thank you for coming in today!  Please increase Hydrochlorothiazide to 25 mg by mouth daily.  You can take two (2) tablets of the 12.5 mg until you run out and then pick up the next prescription for the 25 mg.   Please remember to pick up your Xarelto from [facility]   Come back for a blood pressure check in 2 weeks.

## 2015-01-08 ENCOUNTER — Encounter: Payer: Self-pay | Admitting: Pharmacist

## 2015-01-08 ENCOUNTER — Ambulatory Visit: Payer: Medicare Other | Attending: Family Medicine | Admitting: Pharmacist

## 2015-01-08 VITALS — BP 115/83 | HR 80

## 2015-01-08 DIAGNOSIS — I10 Essential (primary) hypertension: Secondary | ICD-10-CM | POA: Diagnosis not present

## 2015-01-08 DIAGNOSIS — Z720 Tobacco use: Secondary | ICD-10-CM | POA: Insufficient documentation

## 2015-01-08 NOTE — Patient Instructions (Signed)
It was great to see you today!  Your blood pressure looks great. Keep taking both the Xarelto and hydrochlorothiazide.  I am not sure why your voice has not come back, schedule an appointment with the doctor on your way out for follow up  Try to quit smoking all together. I know you are only smoking a cigarette every few days but the best thing you can do for your health is to quit completely.

## 2015-01-08 NOTE — Progress Notes (Addendum)
S:    Patient arrives in good spirits.  He presents to the clinic for blood pressure evaluation.  Patient reports adherence with medications. He denies any missed doses and understands the importance of not missing any doses, especially the Xarelto.   Current BP Medications include:  Hydrochlorothiazide 25 mg daily  Patient denies any adverse effects from his Xarelto or hydrochlorothiazide. He reports overall feeling very well except for the change in his voice. He denies any bleeding, chest pain, or leg swelling.  Patient reports that his voice is still different from when he was hospitalized. He says that it changed during the hospitalization with his blood clot and that it has never returned back to normal. He denies any signs or symptoms of GERD or acid reflux. He reports smoking 1 cigarette every three days. He reports an infrequent cough and reports that sometimes he will cough phlegm up but it is rare. He denies throat pain.     O:   Last 3 Office BP readings: BP Readings from Last 3 Encounters:  01/08/15 115/83  12/25/14 142/94  11/28/14 155/92    BMET    Component Value Date/Time   NA 142 11/28/2014 1040   K 3.9 11/28/2014 1040   CL 102 11/28/2014 1040   CO2 30 11/28/2014 1040   GLUCOSE 101* 11/28/2014 1040   BUN 7 11/28/2014 1040   CREATININE 0.93 11/28/2014 1040   CREATININE 0.92 11/19/2014 0330   CALCIUM 8.6 11/28/2014 1040   GFRNONAA 86 11/28/2014 1040   GFRNONAA >60 11/19/2014 0330   GFRAA >89 11/28/2014 1040   GFRAA >60 11/19/2014 0330    A/P:  Hypertension: currently is controlled on hydrochlorothiazide 25 mg daily.  No recommendations for any changes in medication at this time. Recommend that patient get BMET at next visit with physician (will defer ordering it now in case further tests are ordered).   Lost voice: patient's voice is still "different" per patient. No evidence of acid reflux or infection. I wonder if it could be due to tobacco abuse though  tobacco use is only every few days. Could be a result of damage from PE and hypoxia but will defer work up to physician. I told the patient that the most important thing that he could do for his health is to completely quit smoking and that even smoking infrequently can damage his health. Patient was not interested in further smoking cessation counseling at this time.   Results reviewed and written information provided.   F/U Clinic Visit when available with new provider to be assigned at check out (was seeing Dr. Orpah Cobb). I can follow up with patient as needed for access to care.  Total time in face-to-face counseling 20 minutes.

## 2015-01-09 NOTE — Telephone Encounter (Signed)
Patient's xarelto was already refilled on 12/25/14. She is transition to 20 mg daily, she has 3 refills.

## 2015-03-13 ENCOUNTER — Encounter: Payer: Self-pay | Admitting: Family Medicine

## 2015-03-13 ENCOUNTER — Ambulatory Visit: Payer: Medicare Other | Attending: Family Medicine | Admitting: Family Medicine

## 2015-03-13 VITALS — BP 138/88 | HR 97 | Temp 97.7°F | Resp 16 | Wt 192.8 lb

## 2015-03-13 DIAGNOSIS — F17210 Nicotine dependence, cigarettes, uncomplicated: Secondary | ICD-10-CM

## 2015-03-13 DIAGNOSIS — I2699 Other pulmonary embolism without acute cor pulmonale: Secondary | ICD-10-CM

## 2015-03-13 DIAGNOSIS — R7981 Abnormal blood-gas level: Secondary | ICD-10-CM | POA: Diagnosis not present

## 2015-03-13 LAB — CBC
HEMATOCRIT: 42.8 % (ref 39.0–52.0)
HEMOGLOBIN: 14.1 g/dL (ref 13.0–17.0)
MCH: 29.1 pg (ref 26.0–34.0)
MCHC: 32.9 g/dL (ref 30.0–36.0)
MCV: 88.2 fL (ref 78.0–100.0)
MPV: 9.4 fL (ref 8.6–12.4)
Platelets: 283 10*3/uL (ref 150–400)
RBC: 4.85 MIL/uL (ref 4.22–5.81)
RDW: 18.8 % — ABNORMAL HIGH (ref 11.5–15.5)
WBC: 9.5 10*3/uL (ref 4.0–10.5)

## 2015-03-13 MED ORDER — ALBUTEROL SULFATE HFA 108 (90 BASE) MCG/ACT IN AERS: 2.0000 | INHALATION_SPRAY | Freq: Four times a day (QID) | RESPIRATORY_TRACT | Status: AC | PRN
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
## 2015-03-13 NOTE — Assessment & Plan Note (Signed)
A: low O2, multifactorial. Heavy smoker, b/l PE hx with possible RV dysfunction P: CBC ECHO CTA Advised smoking cessation Albuterol prn Continue home O2

## 2015-03-13 NOTE — Assessment & Plan Note (Signed)
A: PE 5 months ago b/l with R heart strain. Patient decline anticoagulation. Declines ASA.  P: Repeat CTA ECHO

## 2015-03-13 NOTE — Patient Instructions (Addendum)
Mark Cantu was seen today for hypertension and pulmonary embolism.  Diagnoses and all orders for this visit:  Moderate smoker (20 or less per day)  Bilateral pulmonary embolism (HCC) -     Echocardiogram; Future -     CT Angio Chest PE W/Cm &/Or Wo Cm; Future -     CBC -     Basic Metabolic Panel  Low oxygen saturation -     albuterol (PROVENTIL HFA;VENTOLIN HFA) 108 (90 BASE) MCG/ACT inhaler; Inhale 2 puffs into the lungs every 6 (six) hours as needed for wheezing or shortness of breath.  you will be called with appt details and lab results  Smoking cessation support: smoking cessation hotline: 1-800-QUIT-NOW.  Smoking cessation classes are available through [facility]. Call [phone] or visit our website at [email or web address].  F/u in 2 months for PE/low O2  Dr. Armen PickupFunches

## 2015-03-14 LAB — BASIC METABOLIC PANEL
BUN: 10 mg/dL (ref 7–25)
CO2: 24 mmol/L (ref 20–31)
CREATININE: 1.13 mg/dL (ref 0.70–1.25)
Calcium: 8.9 mg/dL (ref 8.6–10.3)
Chloride: 102 mmol/L (ref 98–110)
GLUCOSE: 81 mg/dL (ref 65–99)
Potassium: 4.6 mmol/L (ref 3.5–5.3)
Sodium: 137 mmol/L (ref 135–146)

## 2015-03-16 ENCOUNTER — Telehealth: Payer: Self-pay | Admitting: *Deleted

## 2015-03-16 NOTE — Telephone Encounter (Signed)
[facility] Nov 10,2016 at 08:00 arriving 15 min early NPO after mid night [facility] Nov [facility]2016 9:00 Left appointme information with Theodoro DoingFelicia Mcginley

## 2015-03-18 ENCOUNTER — Ambulatory Visit (HOSPITAL_COMMUNITY): Admission: RE | Admit: 2015-03-18 | Payer: Medicare Other | Source: Ambulatory Visit

## 2015-03-19 ENCOUNTER — Ambulatory Visit (HOSPITAL_COMMUNITY): Payer: Medicare Other | Attending: Family Medicine

## 2015-03-19 ENCOUNTER — Telehealth: Payer: Self-pay | Admitting: *Deleted

## 2015-03-19 NOTE — Telephone Encounter (Signed)
LVM to return call.

## 2015-03-19 NOTE — Telephone Encounter (Signed)
-----   Message from Dessa PhiJosalyn Funches, MD sent at 03/16/2015  8:34 AM EST ----- Normal CBC and BMP

## 2015-03-29 ENCOUNTER — Emergency Department (HOSPITAL_COMMUNITY)
Admission: EM | Admit: 2015-03-29 | Discharge: 2015-04-09 | Disposition: E | Payer: Medicare Other | Attending: Emergency Medicine | Admitting: Emergency Medicine

## 2015-03-29 ENCOUNTER — Encounter (HOSPITAL_COMMUNITY): Payer: Self-pay | Admitting: *Deleted

## 2015-03-29 DIAGNOSIS — Z86711 Personal history of pulmonary embolism: Secondary | ICD-10-CM | POA: Diagnosis not present

## 2015-03-29 DIAGNOSIS — Z8739 Personal history of other diseases of the musculoskeletal system and connective tissue: Secondary | ICD-10-CM | POA: Insufficient documentation

## 2015-03-29 DIAGNOSIS — Z8659 Personal history of other mental and behavioral disorders: Secondary | ICD-10-CM | POA: Diagnosis not present

## 2015-03-29 DIAGNOSIS — F17210 Nicotine dependence, cigarettes, uncomplicated: Secondary | ICD-10-CM | POA: Diagnosis not present

## 2015-03-29 DIAGNOSIS — Y9389 Activity, other specified: Secondary | ICD-10-CM | POA: Insufficient documentation

## 2015-03-29 DIAGNOSIS — I469 Cardiac arrest, cause unspecified: Secondary | ICD-10-CM | POA: Diagnosis not present

## 2015-03-29 DIAGNOSIS — Y999 Unspecified external cause status: Secondary | ICD-10-CM | POA: Insufficient documentation

## 2015-03-29 DIAGNOSIS — R0681 Apnea, not elsewhere classified: Secondary | ICD-10-CM | POA: Diagnosis present

## 2015-03-29 DIAGNOSIS — Y92410 Unspecified street and highway as the place of occurrence of the external cause: Secondary | ICD-10-CM | POA: Diagnosis not present

## 2015-03-29 DIAGNOSIS — I10 Essential (primary) hypertension: Secondary | ICD-10-CM | POA: Diagnosis not present

## 2015-03-29 HISTORY — DX: Other pulmonary embolism without acute cor pulmonale: I26.99

## 2015-03-29 MED ORDER — EPINEPHRINE HCL 0.1 MG/ML IJ SOSY
PREFILLED_SYRINGE | INTRAMUSCULAR | Status: AC | PRN
  Administered 2015-03-29: 1 via INTRAVENOUS

## 2015-03-31 MED FILL — Medication: Qty: 1 | Status: AC
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
## 2015-04-09 NOTE — Progress Notes (Addendum)
Chaplain arrived post-CPR. Received info from RN that pt's niece is listed as emergency contact. Chaplain called Theodoro DoingFelicia Petrilla, [phone] and left message asking that family member come to hospital.  Sat with MD and family as he informed them of patient death.  Niece  Very upset.   Escorted family to [address] for viewing of body.  Other family gathered.    Prayer. I confirmed NOK info and gave her the phone number for Patient Placement when they made decisions on funeral home.  Rev. Audie BoxJan Hill [phone]

## 2015-04-09 NOTE — Code Documentation (Addendum)
Pt in PEA upon arrival.  cpr continued.  No response with epi.
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
## 2015-04-09 DEATH — deceased

## 2015-12-24 ENCOUNTER — Telehealth: Payer: Self-pay | Admitting: Family Medicine

## 2015-12-24 NOTE — Telephone Encounter (Signed)
Susan from [employer] would like a call from the doctor discussing patients cause of death.     Please call at [phone] Reference number:  [id number]

## 2015-12-25 NOTE — Telephone Encounter (Signed)
Called the onsite pharmacy: Of note patient was prescribed ventolin by me on 11/42016, it has been on hold since it was prescribed. Patient never got it filled.

## 2015-12-25 NOTE — Telephone Encounter (Signed)
Called back to [facility]. Left VM requesting return call

## 2016-05-02 IMAGING — CR DG CHEST 2V
3 series · 3 of 3 positions shown · non-contrast
Comparison: No priors.

CLINICAL DATA: 63-year-old male with shortness breath for the past
5 weeks progressively getting worse.

EXAM:
CHEST  2 VIEW

[w chest pa (1 of 2)]
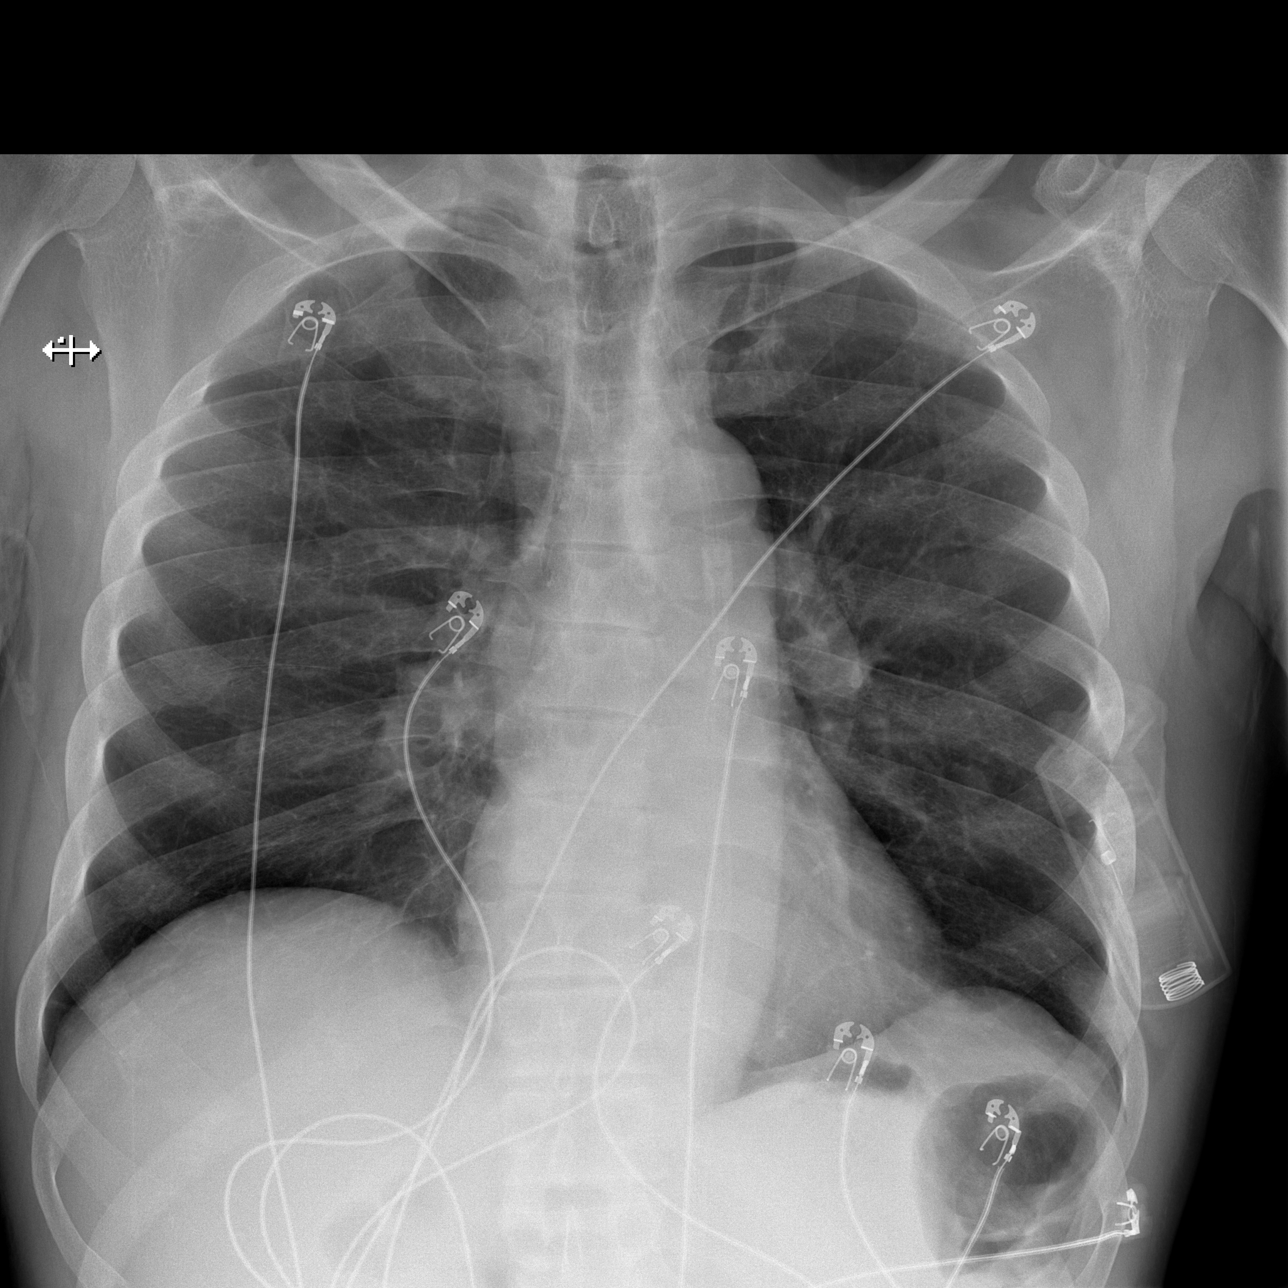

[w chest pa (2 of 2)]
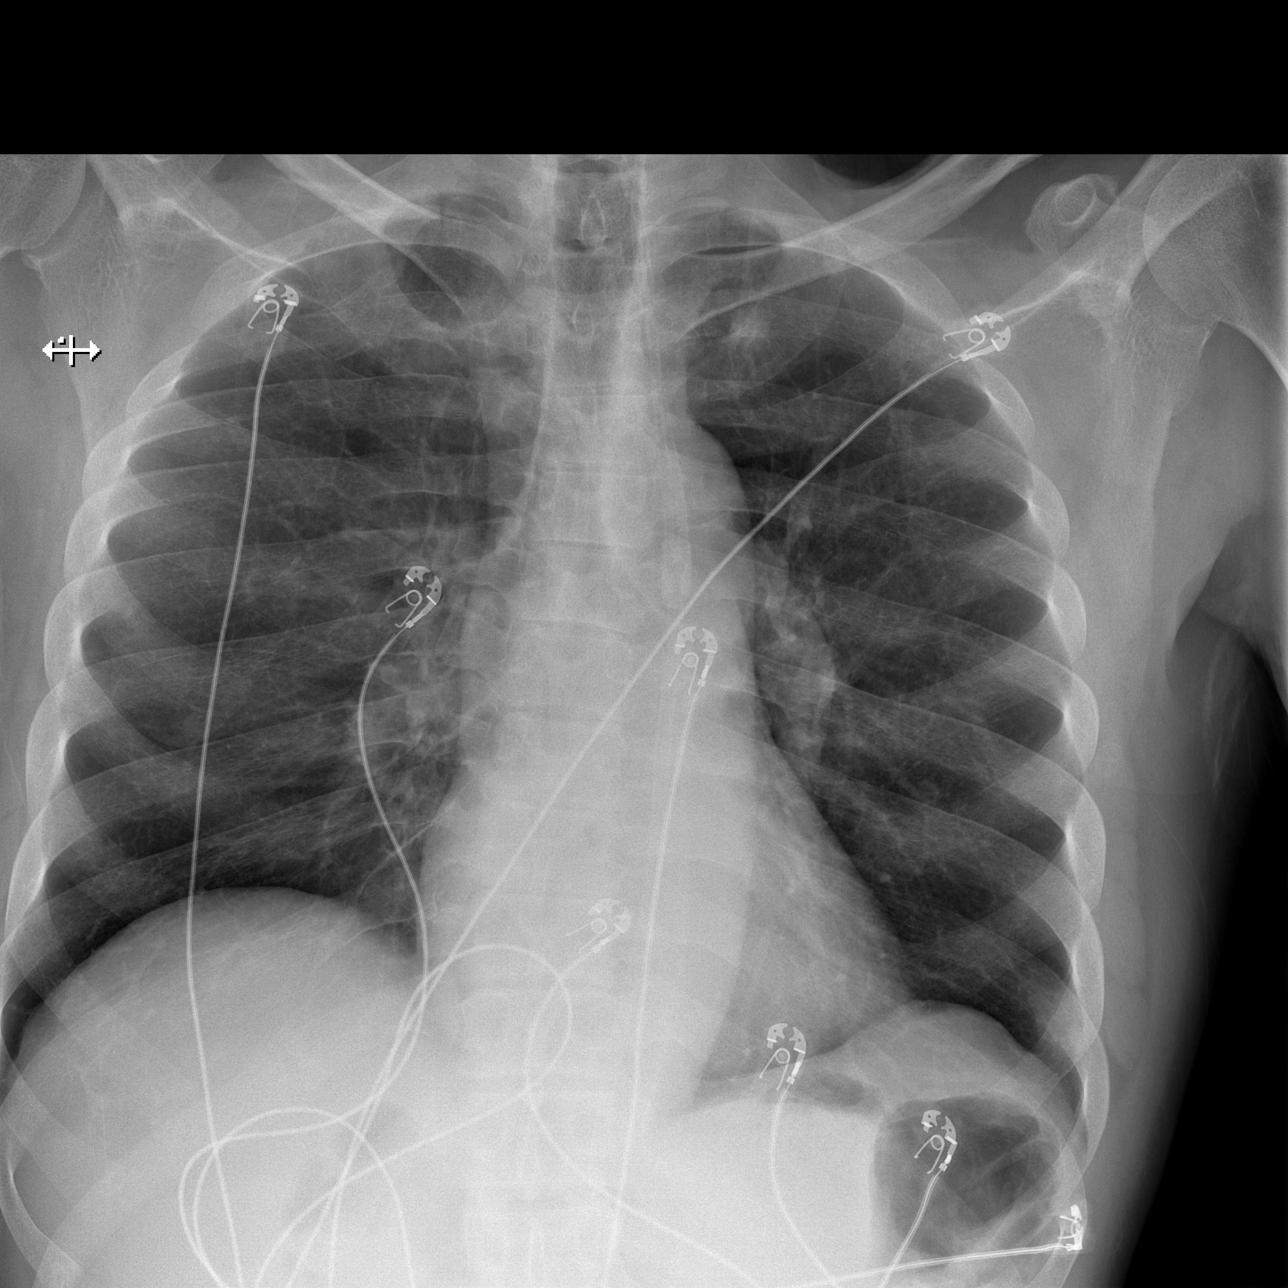

[w chest lat]
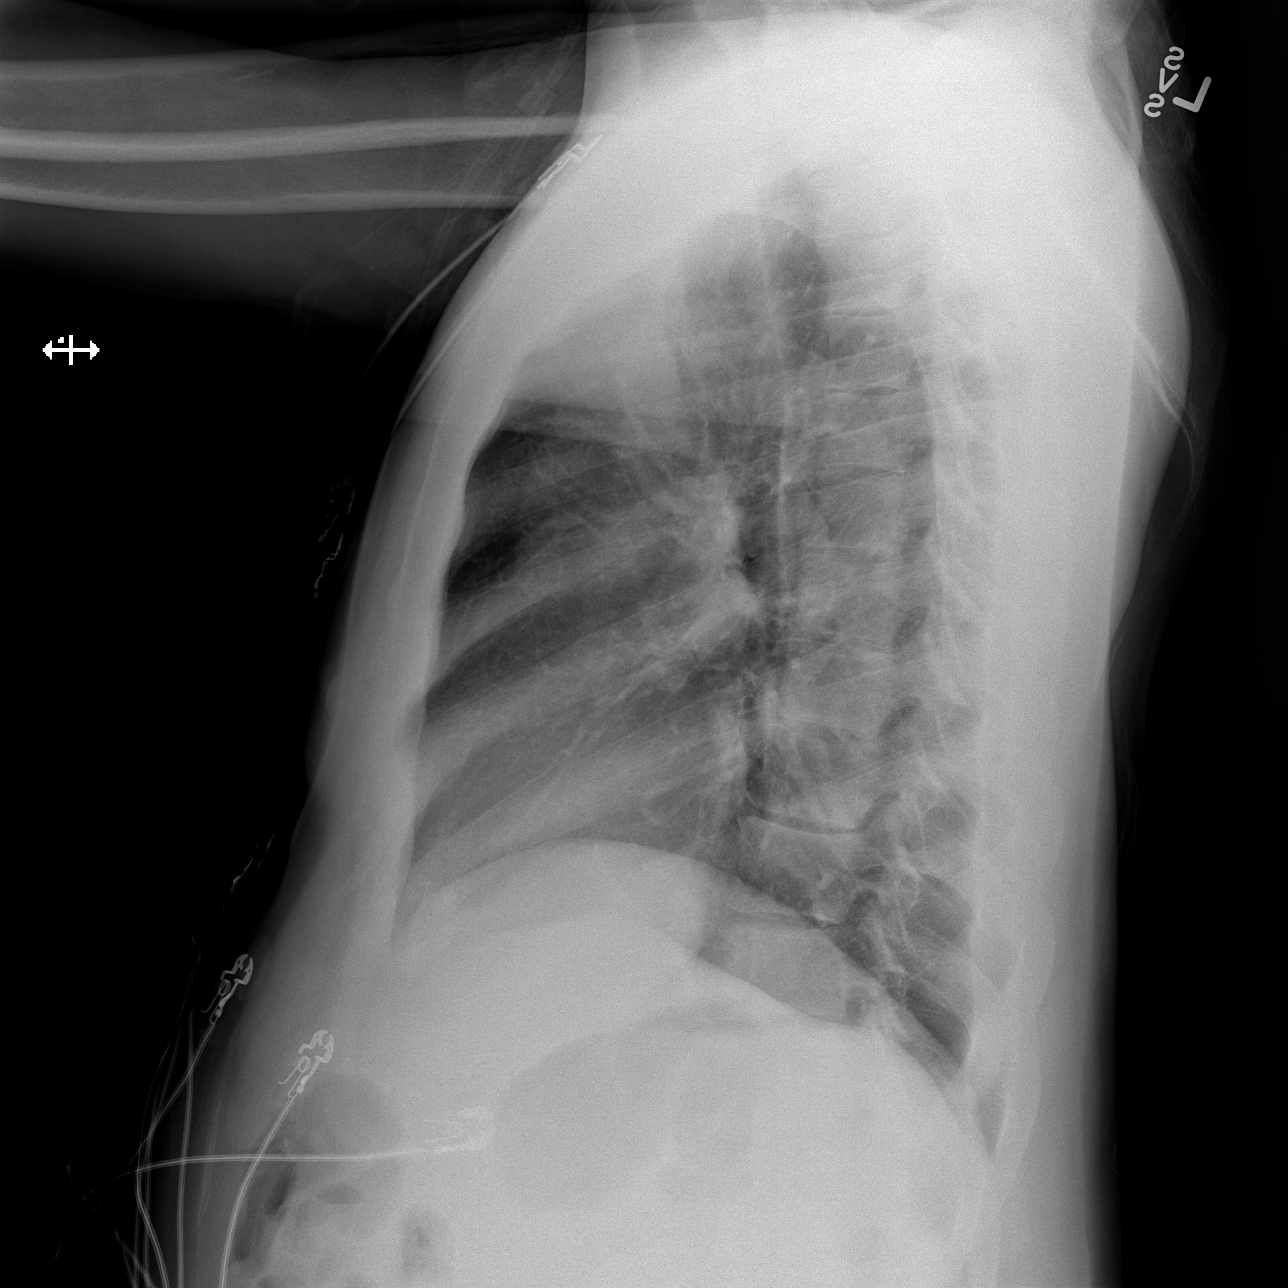

[3 of 3 positions shown; findings below may reference images not displayed]

FINDINGS: Lung volumes are normal. No consolidative airspace disease. No
pleural effusions. No pneumothorax. No pulmonary nodule or mass
noted. Pulmonary vasculature and the cardiomediastinal silhouette
are within normal limits. Atherosclerotic calcifications in the
thoracic aorta.
IMPRESSION: 1. No radiographic evidence of acute cardiopulmonary disease.
2. Atherosclerosis.
